# Patient Record
Sex: Male | Born: 1990 | Hispanic: Yes | Marital: Single | Smoking: Never smoker
Health system: Southern US, Community
[De-identification: ages and names within clinical notes are randomized; demographics above are authoritative.]

## PROBLEM LIST (undated history)

## (undated) DIAGNOSIS — K219 Gastro-esophageal reflux disease without esophagitis: Secondary | ICD-10-CM

## (undated) DIAGNOSIS — K8020 Calculus of gallbladder without cholecystitis without obstruction: Secondary | ICD-10-CM

## (undated) HISTORY — PX: NO PAST SURGERIES: SHX2092

---

## 2017-02-24 ENCOUNTER — Ambulatory Visit (INDEPENDENT_AMBULATORY_CARE_PROVIDER_SITE_OTHER): Payer: Self-pay | Admitting: Physician Assistant

## 2017-02-24 VITALS — BP 130/80 | HR 90 | Temp 97.6°F | Resp 18 | Ht 68.5 in | Wt 241.1 lb

## 2017-02-24 DIAGNOSIS — L309 Dermatitis, unspecified: Secondary | ICD-10-CM

## 2017-02-24 DIAGNOSIS — Z833 Family history of diabetes mellitus: Secondary | ICD-10-CM | POA: Insufficient documentation

## 2017-02-24 DIAGNOSIS — Z6836 Body mass index (BMI) 36.0-36.9, adult: Secondary | ICD-10-CM | POA: Insufficient documentation

## 2017-02-24 MED ORDER — PERMETHRIN 5 % EX CREA: 1.0000 "application " | TOPICAL_CREAM | Freq: Once | CUTANEOUS | 1 refills | Status: AC

## 2017-02-24 NOTE — Patient Instructions (Addendum)
Massage the cream thoroughly into the skin from the neck to the soles of the feet, including areas under the fingernails and toenails. Thirty grams is usually sufficient for a single application for an average adult. Your tube will have 60 grams.  In young children, scalp involvement is common. Therefore, permethrin should also be applied to the scalp and face (sparing the eyes and mouth) in this population. Permethrin should be removed by washing (shower or bath) after 8 to 14 hours. Treatment is often performed overnight.    A second application one to two weeks later may be necessary to eliminate mites and is typically performed. However, the relative efficacy of one versus two applications of permethrin has not been studied.  Drink at least 64 ounces of water daily. Consider a humidifier for the room where you sleep. Bathe once daily. Avoid using HOT water, as it dries skin. Avoid deodorant soaps (Dial is the worst!) and stick with gentle cleansers (I like Cetaphil Liquid Cleanser). After bathing, dry off completely, then apply a thick emollient cream (I like Cetaphil Moisturizing Cream). Apply the cream twice daily, or more!   IF you received an x-ray today, you will receive an invoice from [facility]. Please contact [facility] at [phone] with questions or concerns regarding your invoice.   IF you received labwork today, you will receive an invoice from [facility]. Please contact [facility] at [phone] with questions or concerns regarding your invoice.   Our billing staff will not be able to assist you with questions regarding bills from these companies.  You will be contacted with the lab results as soon as they are available. The fastest way to get your results is to activate your My Chart account. Instructions are located on the last page of this paperwork. If you have not heard from [place] regarding the results in 2 weeks, please contact this office.       Scabies, Adult Scabies is a skin condition that happens when very small insects get under the skin (infestation). This causes a rash and severe itchiness. Scabies can spread from person to person (is contagious). If you get scabies, it is common for others in your household to get scabies too. With proper treatment, symptoms usually go away in 2-4 weeks. Scabies usually does not cause lasting problems. What are the causes? This condition is caused by mites (Sarcoptes scabiei, or human itch mites) that can only be seen with a microscope. The mites get into the top layer of skin and lay eggs. Scabies can spread from person to person through:  Close contact with a person who has scabies.  Contact with infested items, such as towels, bedding, or clothing. What increases the risk? This condition is more likely to develop in:  People who live in nursing homes and other extended-care facilities.  People who have sexual contact with a partner who has scabies.  Young children who attend child care facilities.  People who care for others who are at increased risk for scabies. What are the signs or symptoms? Symptoms of this condition may include:  Severe itchiness. This is often worse at night.  A rash that includes tiny red bumps or blisters. The rash commonly occurs on the wrist, elbow, armpit, fingers, waist, groin, or buttocks. Bumps may form a line (burrow) in some areas.  Skin irritation. This can include scaly patches or sores. How is this diagnosed? This condition is diagnosed with a physical exam. Your health care provider will look closely at  your skin. In some cases, your health care provider may take a sample of your affected skin (skin scraping) and have it examined under a microscope. How is this treated? This condition may be treated with:  Medicated cream or lotion that kills the mites. This is spread on the entire body and left on for several hours. Usually, one  treatment with medicated cream or lotion is enough to kill all of the mites. In severe cases, the treatment may be repeated.  Medicated cream that relieves itching.  Medicines that help to relieve itching.  Medicines that kill the mites. This treatment is rarely used. Follow these instructions at home:   Medicines   Take or apply over-the-counter and prescription medicines as told by your health care provider.  Apply medicated cream or lotion as told by your health care provider.  Do not wash off the medicated cream or lotion until the necessary amount of time has passed. Skin Care   Avoid scratching your affected skin.  Keep your fingernails closely trimmed to reduce injury from scratching.  Take cool baths or apply cool washcloths to help reduce itching. General instructions   Clean all items that you recently had contact with, including bedding, clothing, and furniture. Do this on the same day that your treatment starts.  Use hot water when you wash items.  Place unwashable items into closed, airtight plastic bags for at least 3 days. The mites cannot live for more than 3 days away from human skin.  Vacuum furniture and mattresses that you use.  Make sure that other people who may have been infested are examined by a health care provider. These include members of your household and anyone who may have had contact with infested items.  Keep all follow-up visits as told by your health care provider. This is important. Contact a health care provider if:  You have itching that does not go away after 4 weeks of treatment.  You continue to develop new bumps or burrows.  You have redness, swelling, or pain in your rash area after treatment.  You have fluid, blood, or pus coming from your rash. This information is not intended to replace advice given to you by your health care provider. Make sure you discuss any questions you have with your health care provider. Document  Released: 08/05/2015 Document Revised: 04/21/2016 Document Reviewed: 06/16/2015 Elsevier Interactive Patient Education  2017 [employer].

## 2017-02-24 NOTE — Progress Notes (Signed)
Patient ID: Timothy Hebert, male     DOB: [birth year], 26 y.o.    MRN: [id number]  PCP: No PCP Per Patient  Chief Complaint  Patient presents with  . Skin Problem    on arm & legs x 1 week. itches & looks like small bumps. Younger brothers were told it may be scabies    Subjective:   This patient is new to this practice and presents for evaluation of an itchiy skin rash on the arms, legs and axilla. Also tops of the feet. Worse at night. At first thought it was allergic, and took cetirizine and used topical steroid cream.  Yesterday his younger brothers were diagnosed with scabies and were prescribed permethrin cream.    Review of Systems  Constitutional: Negative for chills and fever.  Respiratory: Negative for cough and shortness of breath.   Cardiovascular: Negative for chest pain, palpitations and leg swelling.  Gastrointestinal: Negative for diarrhea, nausea and vomiting.  Endocrine: Negative for polydipsia.  Genitourinary: Negative for dysuria, frequency and urgency.  Musculoskeletal: Negative for myalgias.  Skin: Negative for color change, pallor, rash and wound.  Neurological: Negative for dizziness and headaches.     Prior to Admission medications   Not on File     No Known Allergies   There are no active problems to display for this patient.    Family History  Problem Relation Age of Onset  . Diabetes Father      Social History   Social History  . Marital status: Single    Spouse name: N/A  . Number of children: 0  . Years of education: N/A   Occupational History  . student     [employer], anticipated graduation 03/2017   Social History Main Topics  . Smoking status: Never Smoker  . Smokeless tobacco: Never Used  . Alcohol use No  . Drug use: No  . Sexual activity: Not on file   Other Topics Concern  . Not on file   Social History Narrative   Lives with his parents and 3 younger brothers.   Born in [place]. Came to  the [place] since age 89 years.            Objective:  Physical Exam  Constitutional: He is oriented to person, place, and time. He appears well-developed and well-nourished. He is active and cooperative. No distress.  BP 130/80   Pulse 90   Temp 97.6 F (36.4 C) (Oral)   Resp 18   Ht 5' 8.5" (1.74 m)   Wt 241 lb 2 oz (109.4 kg)   SpO2 98%   BMI 36.13 kg/m    Eyes: Conjunctivae are normal.  Pulmonary/Chest: Effort normal.  Neurological: He is alert and oriented to person, place, and time.  Skin: Skin is warm and dry. Rash noted. Rash is papular (mostly excoriated, inner surface of both arms, flanks, lower legs and tops of feet; few lesions in the web spaces between the LEFT index-middle-ring fingers). He is not diaphoretic.  Psychiatric: He has a normal mood and affect. His speech is normal and behavior is normal.        Assessment & Plan:  1. Dermatitis Given close contacts with scabies, elect to treat as such. Anticipatory guidance provided regarding treatment and potential adverse effects, specifically, dry itchy skin.  - permethrin (ELIMITE) 5 % cream; Apply 1 application topically once. Repeat in 1 week if needed.  Dispense: 60 g; Refill: 1   Folasade Mooty S.  Leotis Shames, PA-C Physician Assistant-Certified Primary Care at [facility]

## 2017-04-27 ENCOUNTER — Ambulatory Visit (INDEPENDENT_AMBULATORY_CARE_PROVIDER_SITE_OTHER): Payer: Self-pay | Admitting: Physician Assistant

## 2017-04-27 ENCOUNTER — Ambulatory Visit (INDEPENDENT_AMBULATORY_CARE_PROVIDER_SITE_OTHER): Payer: Self-pay

## 2017-04-27 ENCOUNTER — Encounter: Payer: Self-pay | Admitting: Physician Assistant

## 2017-04-27 VITALS — BP 124/86 | HR 103 | Temp 97.5°F | Resp 16 | Ht 68.11 in | Wt 224.8 lb

## 2017-04-27 DIAGNOSIS — R824 Acetonuria: Secondary | ICD-10-CM

## 2017-04-27 DIAGNOSIS — L309 Dermatitis, unspecified: Secondary | ICD-10-CM

## 2017-04-27 DIAGNOSIS — R110 Nausea: Secondary | ICD-10-CM

## 2017-04-27 DIAGNOSIS — K5900 Constipation, unspecified: Secondary | ICD-10-CM

## 2017-04-27 DIAGNOSIS — R634 Abnormal weight loss: Secondary | ICD-10-CM

## 2017-04-27 DIAGNOSIS — R142 Eructation: Secondary | ICD-10-CM

## 2017-04-27 DIAGNOSIS — R6881 Early satiety: Secondary | ICD-10-CM

## 2017-04-27 LAB — POCT URINALYSIS DIP (MANUAL ENTRY)
GLUCOSE UA: NEGATIVE mg/dL
Leukocytes, UA: NEGATIVE
Nitrite, UA: NEGATIVE
Protein Ur, POC: 30 mg/dL — AB
RBC UA: NEGATIVE
SPEC GRAV UA: 1.025 (ref 1.010–1.025)
UROBILINOGEN UA: 0.2 U/dL
pH, UA: 6 (ref 5.0–8.0)

## 2017-04-27 LAB — POCT GLYCOSYLATED HEMOGLOBIN (HGB A1C): Hemoglobin A1C: 5.3

## 2017-04-27 MED ORDER — KETOCONAZOLE 2 % EX CREA: 1.0000 "application " | TOPICAL_CREAM | Freq: Every day | CUTANEOUS | 1 refills | Status: DC

## 2017-04-27 MED ORDER — RANITIDINE HCL 150 MG PO TABS: 150.0000 mg | ORAL_TABLET | Freq: Two times a day (BID) | ORAL | 0 refills | Status: DC

## 2017-04-27 NOTE — Progress Notes (Signed)
Patient ID: Timothy Hebert, male    DOB: 08-Feb-1991, 26 y.o.   MRN: [id number]  PCP: Timothy Hebert  Chief Complaint  Patient presents with  . Gastric Concerns    Hebert patient, has severe belching after eating, using OTC heartburn and gas medications; patient reports felling "full of air" each morning    Subjective:   Presents for evaluation of belching, early satiety. He is accompanied by Timothy Hebert.  Describes increased belching x 3-4 months, associated with increased flatulence and constipation. OTC Miralax helped the constipation. OTC Gaviscon and Phazyme helped the belching some.  On Thursday (5/24), he notes that he wasn't eating as much, due to the constipation. Ran at the gym, did Timothy normal routine, but felt a bit lightheaded. At home, in the shower, he began feeling worse. "Felt like I wasn't breathing right, was disoriented." He denies feeling confused, but rather jittery and weak. Fingers went numb. Vision felt dark. He asked Timothy Hebert and Hebert to call 911. He did not faint. EMS arrived and helped him slow Timothy breathing, and they stayed about 30 minutes, until he was back to Timothy normal state. He relates that he thinks he had a panic attack.  Stopped the Miralax following the panic attack because he wasn't eating as much. "Anything that I eat, I feel full," starts burping, feeling like he wants to vomit. OTC antacids help the nausea.   No hematochezia. Dark stool following pepto-bismol. Stopped the product and stools returned to normal color. No urinary symptoms. No fever, chills, sometimes feels over-warm.  No difficulty swallowing. No regurgitation.  Typical diet: Breakfast: Apple, banana and cereal (Honey Bunches of Oats), 2% milk Lunch: varies-usually vegetables, rice and chicken Supper: same as lunch Evening snack: cereal-Honey Bunches of Oats 3-17 oz bottles of water each day Was exercising 3x/week at the gym  Estimates 10 lb weight loss in  the past 2 weeks.  Rash in the bilateral inguinal folds x 2 years. Was itching, but not since use of anti-fungal cream.  Timothy Hebert has diabetes.  Review of Systems As above.    Patient Active Problem List   Diagnosis Date Noted  . BMI 36.0-36.9,adult 02/24/2017  . Family history of diabetes mellitus in Hebert 02/24/2017     Prior to Admission medications   Not on File     No Known Allergies     Objective:  Physical Exam  Constitutional: He is oriented to person, place, and time. He appears well-developed and well-nourished. He is active and cooperative. No distress.  BP 124/86 (BP Location: Right Arm, Patient Position: Sitting, Cuff Size: Large)   Pulse (!) 103   Temp 97.5 F (36.4 C) (Oral)   Resp 16   Ht 5' 8.11" (1.73 m)   Wt 224 lb 12.8 oz (102 kg)   SpO2 99%   BMI 34.07 kg/m   HENT:  Head: Normocephalic and atraumatic.  Right Ear: Hearing normal.  Left Ear: Hearing normal.  Eyes: Conjunctivae are normal. No scleral icterus.  Neck: Normal range of motion. Neck supple. No thyromegaly present.  Cardiovascular: Normal rate, regular rhythm and normal heart sounds.   Pulses:      Radial pulses are 2+ on the right side, and 2+ on the left side.  Pulmonary/Chest: Effort normal and breath sounds normal.  Abdominal: Soft. Normal appearance, normal aorta and bowel sounds are normal. There is no hepatosplenomegaly. There is no tenderness ("pressure" sensation in the suprapubic and RLQ with palpation).  There is no rigidity, no rebound, no guarding, no CVA tenderness, no tenderness at McBurney's point and negative Murphy's sign. No hernia.  Lymphadenopathy:       Head (right side): No tonsillar, no preauricular, no posterior auricular and no occipital adenopathy present.       Head (left side): No tonsillar, no preauricular, no posterior auricular and no occipital adenopathy present.    He has no cervical adenopathy.       Right: No supraclavicular adenopathy present.        Left: No supraclavicular adenopathy present.  Neurological: He is alert and oriented to person, place, and time. No sensory deficit.  Skin: Skin is warm, dry and intact. No rash noted. No cyanosis or erythema. Nails show no clubbing.  Psychiatric: He has a normal mood and affect. Timothy speech is normal and behavior is normal.      Results for orders placed or performed in visit on 04/27/17  POCT urinalysis dipstick  Result Value Ref Range   Color, UA yellow yellow   Clarity, UA clear clear   Glucose, UA negative negative mg/dL   Bilirubin, UA small (A) negative   Ketones, POC UA >= (160) (A) negative mg/dL   Spec Grav, UA [id number] [id number] - 1.025   Blood, UA negative negative   pH, UA 6.0 5.0 - 8.0   Protein Ur, POC =30 (A) negative mg/dL   Urobilinogen, UA 0.2 0.2 or 1.0 E.U./dL   Nitrite, UA Negative Negative   Leukocytes, UA Negative Negative  POCT glycosylated hemoglobin (Hb A1C)  Result Value Ref Range   Hemoglobin A1C 5.3     Dg Abd Acute W/chest  Result Date: 04/27/2017 CLINICAL DATA:  Constipation, nausea. EXAM: DG ABDOMEN ACUTE W/ 1V CHEST COMPARISON:  None. FINDINGS: There is no evidence of dilated bowel loops or free intraperitoneal air. Moderate stool burden is noted in the right and sigmoid colon. No radiopaque calculi or other significant radiographic abnormality is seen. Heart size and mediastinal contours are within normal limits. Both lungs are clear. IMPRESSION: No abnormal bowel dilatation is noted. Moderate stool burden is noted. No acute cardiopulmonary disease. Electronically Signed   By: Lupita RaiderJames  Green Jr, M.D.   On: 04/27/2017 10:45        Assessment & Plan:   1. Early satiety 2. Belching 4. Nausea without vomiting Possibly GERD. Await remaining labs. Trial of ranitidine. Continue Phazyme. - Comprehensive metabolic panel - CBC with Differential/Platelet - POCT urinalysis dipstick - H. pylori breath test - DG Abd Acute W/Chest; Future - ranitidine  (ZANTAC) 150 MG tablet; Take 1 tablet (150 mg total) by mouth 2 (two) times daily.  Dispense: 60 tablet; Refill: 0  3. Constipation, unspecified constipation type Resume Miralax. Increase dietary fiber and oral hydration. - TSH - T4, free - DG Abd Acute W/Chest; Future  5. Loss of weight Possibly due to healthier eating habits and more regular exercise recently. Ketonuria is an interesting finding, A1C is normal. Await remaining labs. - DG Abd Acute W/Chest; Future  6. Dermatitis Improved previously with OTC anti-fungal cream. Try more potent product. - ketoconazole (NIZORAL) 2 % cream; Apply 1 application topically daily.  Dispense: 60 g; Refill: 1  7. Ketonuria Anticipated elevated A1C, especially in conjunction with weight loss and GI symptoms. Increase oral hydration and recheck in 2 weeks. - POCT glycosylated hemoglobin (Hb A1C)    Return in about 2 weeks (around 05/11/2017) for re-evaluation.   Fernande Brashelle S. Hagan Maltz, PA-C Primary Care at [facility]  

## 2017-04-27 NOTE — Patient Instructions (Signed)
1. Continue the phazyme, 4 times daily (with meals and at bedtime). 2. Resume the Miralax. Take 1-2 doses each day. 3. Drink at LEAST 64 ounces of water every day, more on the days you exercise. 4. Increase the fiber in your diet. 5. Start the ranitidine that I have sent to the pharmacy.      IF you received an x-ray today, you will receive an invoice from [facility]. Please contact [facility] at [phone] with questions or concerns regarding your invoice.   IF you received labwork today, you will receive an invoice from [facility]. Please contact [facility] at [phone] with questions or concerns regarding your invoice.   Our billing staff will not be able to assist you with questions regarding bills from these companies.  You will be contacted with the lab results as soon as they are available. The fastest way to get your results is to activate your My Chart account. Instructions are located on the last page of this paperwork. If you have not heard from us regarding the results in 2 weeks, please contact this office.

## 2017-04-28 LAB — COMPREHENSIVE METABOLIC PANEL
A/G RATIO: 1.7 (ref 1.2–2.2)
ALBUMIN: 5.2 g/dL (ref 3.5–5.5)
ALK PHOS: 58 IU/L (ref 39–117)
ALT: 33 IU/L (ref 0–44)
AST: 26 IU/L (ref 0–40)
BILIRUBIN TOTAL: 0.6 mg/dL (ref 0.0–1.2)
BUN / CREAT RATIO: 13 (ref 9–20)
BUN: 13 mg/dL (ref 6–20)
CO2: 22 mmol/L (ref 18–29)
Calcium: 10.5 mg/dL — ABNORMAL HIGH (ref 8.7–10.2)
Chloride: 104 mmol/L (ref 96–106)
Creatinine, Ser: 1.01 mg/dL (ref 0.76–1.27)
GFR calc non Af Amer: 104 mL/min/{1.73_m2} (ref >59)
GFR, EST AFRICAN AMERICAN: 120 mL/min/{1.73_m2} (ref >59)
GLOBULIN, TOTAL: 3 g/dL (ref 1.5–4.5)
GLUCOSE: 107 mg/dL — AB (ref 65–99)
POTASSIUM: 4.5 mmol/L (ref 3.5–5.2)
SODIUM: 143 mmol/L (ref 134–144)
TOTAL PROTEIN: 8.2 g/dL (ref 6.0–8.5)

## 2017-04-28 LAB — CBC WITH DIFFERENTIAL/PLATELET
Basophils Absolute: 0 10*3/uL (ref 0.0–0.2)
Basos: 0 %
EOS (ABSOLUTE): 0.1 10*3/uL (ref 0.0–0.4)
Eos: 2 %
HEMOGLOBIN: 15.4 g/dL (ref 13.0–17.7)
Hematocrit: 45.2 % (ref 37.5–51.0)
Immature Grans (Abs): 0 10*3/uL (ref 0.0–0.1)
Immature Granulocytes: 0 %
Lymphocytes Absolute: 1.6 10*3/uL (ref 0.7–3.1)
Lymphs: 19 %
MCH: 29.7 pg (ref 26.6–33.0)
MCHC: 34.1 g/dL (ref 31.5–35.7)
MCV: 87 fL (ref 79–97)
MONOS ABS: 0.7 10*3/uL (ref 0.1–0.9)
Monocytes: 8 %
NEUTROS ABS: 6.1 10*3/uL (ref 1.4–7.0)
Neutrophils: 71 %
Platelets: 266 10*3/uL (ref 150–379)
RBC: 5.18 x10E6/uL (ref 4.14–5.80)
RDW: 13.2 % (ref 12.3–15.4)
WBC: 8.7 10*3/uL (ref 3.4–10.8)

## 2017-04-28 LAB — T4, FREE: Free T4: 1.5 ng/dL (ref 0.82–1.77)

## 2017-04-28 LAB — TSH: TSH: 1.9 u[IU]/mL (ref 0.450–4.500)

## 2017-05-01 LAB — H. PYLORI BREATH TEST: H pylori Breath Test: NEGATIVE

## 2017-05-02 ENCOUNTER — Encounter: Payer: Self-pay | Admitting: Physician Assistant

## 2017-05-12 ENCOUNTER — Encounter: Payer: Self-pay | Admitting: Physician Assistant

## 2017-05-12 ENCOUNTER — Ambulatory Visit (INDEPENDENT_AMBULATORY_CARE_PROVIDER_SITE_OTHER): Payer: Self-pay | Admitting: Physician Assistant

## 2017-05-12 VITALS — BP 125/76 | HR 90 | Temp 97.6°F | Resp 18 | Ht 68.11 in | Wt 220.2 lb

## 2017-05-12 DIAGNOSIS — R824 Acetonuria: Secondary | ICD-10-CM

## 2017-05-12 LAB — POCT URINALYSIS DIP (MANUAL ENTRY)
BILIRUBIN UA: NEGATIVE
Blood, UA: NEGATIVE
GLUCOSE UA: NEGATIVE mg/dL
Ketones, POC UA: NEGATIVE mg/dL
LEUKOCYTES UA: NEGATIVE
NITRITE UA: NEGATIVE
PH UA: 6 (ref 5.0–8.0)
Protein Ur, POC: NEGATIVE mg/dL
Spec Grav, UA: 1.025 (ref 1.010–1.025)
Urobilinogen, UA: 0.2 E.U./dL

## 2017-05-12 NOTE — Patient Instructions (Addendum)
Consider resuming the Phazyme to help reduce the belching.  Keep up the great work of being a healthier you!    IF you received an x-ray today, you will receive an invoice from [facility]. Please contact [facility] at [phone] with questions or concerns regarding your invoice.   IF you received labwork today, you will receive an invoice from [facility]. Please contact [facility] at [phone] with questions or concerns regarding your invoice.   Our billing staff will not be able to assist you with questions regarding bills from these companies.  You will be contacted with the lab results as soon as they are available. The fastest way to get your results is to activate your My Chart account. Instructions are located on the last page of this paperwork. If you have not heard from us regarding the results in 2 weeks, please contact this office.

## 2017-05-12 NOTE — Progress Notes (Signed)
Patient ID: Timothy Hebert, male    DOB: [birth year], 26 y.o.   MRN: [id number]  PCP: Porfirio Oar, PA-C  Chief Complaint  Patient presents with  . GI Problem  . Follow-up    Subjective:   Presents for evaluation of early satiety. He is accompanied by his mother. His father is listening in on the mother's phone.  At his visit 5/31 he described frequent belching, flatulence, constipation and feeling full of air. He had cut back on his oral intake, and then had an episode of lightheadedness after exercise. He had been making healthier eating changes and exercising, but weight loss had accelerated.  Labs were normal except ketonuria. A1C was normal. 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  He was advised to resume Miralax, Phazyme and start ranitidine.  Today he reports that he is well. His symptoms are all resolved except the belching, though it is better than before.  He has reduced carbonated beverages. No chewing gum.  Review of Systems As above    Patient Active Problem List   Diagnosis Date Noted  . BMI 36.0-36.9,adult 02/24/2017  . Family history of diabetes mellitus in father 02/24/2017     Prior to Admission medications   Medication Sig Start Date End Date Taking? Authorizing Provider  ketoconazole (NIZORAL) 2 % cream Apply 1 application topically daily. 04/27/17  Yes Merdith Boyd, PA-C  ranitidine (ZANTAC) 150 MG tablet Take 1 tablet (150 mg total) by mouth 2 (two) times daily. 04/27/17  Yes Tamme Mozingo, PA-C     No Known Allergies     Objective:  Physical Exam  Constitutional: He is oriented to person, place, and time. He appears well-developed and well-nourished. He is active and cooperative. No distress.  BP 125/76   Pulse 90   Temp 97.6 F (36.4 C) (Oral)   Resp 18   Ht 5' 8.11" (1.73 m)   Wt 220 lb 3.2 oz (99.9 kg)   SpO2 99%   BMI 33.37 kg/m   HENT:  Head: Normocephalic and atraumatic.  Right Ear: Hearing normal.    Left Ear: Hearing normal.  Eyes: Conjunctivae are normal. No scleral icterus.  Neck: Normal range of motion. Neck supple. No thyromegaly present.  Cardiovascular: Normal rate, regular rhythm and normal heart sounds.   Pulses:      Radial pulses are 2+ on the right side, and 2+ on the left side.  Pulmonary/Chest: Effort normal and breath sounds normal.  Abdominal: Soft. Normal appearance and bowel sounds are normal. There is no hepatosplenomegaly. There is no tenderness.  Lymphadenopathy:       Head (right side): No tonsillar, no preauricular, no posterior auricular and no occipital adenopathy present.       Head (left side): No tonsillar, no preauricular, no posterior auricular and no occipital adenopathy present.    He has no cervical adenopathy.       Right: No supraclavicular adenopathy present.       Left: No supraclavicular adenopathy present.  Neurological: He is alert and oriented to person, place, and time. No sensory deficit.  Skin: Skin is warm, dry and intact. No rash noted. No cyanosis or erythema. Nails show no clubbing.  Psychiatric: He has a normal mood and affect. His speech is normal and behavior is normal.    Wt Readings from Last 3 Encounters:  05/12/17 220 lb 3.2 oz (99.9 kg)  04/27/17 224 lb 12.8 oz (102 kg)  02/24/17 241 lb 2 oz (109.4 kg)  Results for orders placed or performed in visit on 05/12/17  POCT urinalysis dipstick  Result Value Ref Range   Color, UA yellow yellow   Clarity, UA clear clear   Glucose, UA negative negative mg/dL   Bilirubin, UA negative negative   Ketones, POC UA negative negative mg/dL   Spec Grav, UA 9.1471.025 8.2951.010 - 1.025   Blood, UA negative negative   pH, UA 6.0 5.0 - 8.0   Protein Ur, POC negative negative mg/dL   Urobilinogen, UA 0.2 0.2 or 1.0 E.U./dL   Nitrite, UA Negative Negative   Leukocytes, UA Negative Negative       Assessment & Plan:   1. Ketonuria Resolved. - POCT urinalysis dipstick  Continue healthy  lifestyle changes. Consider restarting Phazyme.   Return in about 3 months (around 08/12/2017) for re-evaluation of belching, reflux, constipation, healthy lifestyle changes.   Fernande Brashelle S. Alaycia Eardley, PA-C Primary Care at [facility]

## 2017-06-24 ENCOUNTER — Emergency Department (HOSPITAL_COMMUNITY): Payer: Self-pay

## 2017-06-24 ENCOUNTER — Emergency Department (HOSPITAL_COMMUNITY)
Admission: EM | Admit: 2017-06-24 | Discharge: 2017-06-24 | Disposition: A | Discharge status: Home / Self Care | Payer: Self-pay | Attending: Emergency Medicine | Admitting: Emergency Medicine

## 2017-06-24 ENCOUNTER — Encounter (HOSPITAL_COMMUNITY): Payer: Self-pay | Admitting: Emergency Medicine

## 2017-06-24 DIAGNOSIS — R748 Abnormal levels of other serum enzymes: Secondary | ICD-10-CM | POA: Insufficient documentation

## 2017-06-24 DIAGNOSIS — K8020 Calculus of gallbladder without cholecystitis without obstruction: Secondary | ICD-10-CM | POA: Insufficient documentation

## 2017-06-24 DIAGNOSIS — R1010 Upper abdominal pain, unspecified: Secondary | ICD-10-CM

## 2017-06-24 DIAGNOSIS — Z79899 Other long term (current) drug therapy: Secondary | ICD-10-CM | POA: Insufficient documentation

## 2017-06-24 DIAGNOSIS — R112 Nausea with vomiting, unspecified: Secondary | ICD-10-CM | POA: Insufficient documentation

## 2017-06-24 HISTORY — DX: Gastro-esophageal reflux disease without esophagitis: K21.9

## 2017-06-24 LAB — CBC WITH DIFFERENTIAL/PLATELET
Basophils Absolute: 0 K/uL (ref 0.0–0.1)
Basophils Relative: 0 %
Eosinophils Absolute: 0.1 K/uL (ref 0.0–0.7)
Eosinophils Relative: 1 %
HCT: 39.3 % (ref 39.0–52.0)
Hemoglobin: 13.7 g/dL (ref 13.0–17.0)
Lymphocytes Relative: 10 %
Lymphs Abs: 1.6 K/uL (ref 0.7–4.0)
MCH: 29.9 pg (ref 26.0–34.0)
MCHC: 34.9 g/dL (ref 30.0–36.0)
MCV: 85.8 fL (ref 78.0–100.0)
Monocytes Absolute: 0.7 K/uL (ref 0.1–1.0)
Monocytes Relative: 5 %
Neutro Abs: 13.3 K/uL — ABNORMAL HIGH (ref 1.7–7.7)
Neutrophils Relative %: 84 %
Platelets: 228 K/uL (ref 150–400)
RBC: 4.58 MIL/uL (ref 4.22–5.81)
RDW: 12 % (ref 11.5–15.5)
WBC: 15.7 K/uL — ABNORMAL HIGH (ref 4.0–10.5)

## 2017-06-24 LAB — COMPREHENSIVE METABOLIC PANEL WITH GFR
ALT: 119 U/L — ABNORMAL HIGH (ref 17–63)
AST: 136 U/L — ABNORMAL HIGH (ref 15–41)
Albumin: 4.6 g/dL (ref 3.5–5.0)
Alkaline Phosphatase: 71 U/L (ref 38–126)
Anion gap: 7 (ref 5–15)
BUN: 18 mg/dL (ref 6–20)
CO2: 27 mmol/L (ref 22–32)
Calcium: 9.4 mg/dL (ref 8.9–10.3)
Chloride: 105 mmol/L (ref 101–111)
Creatinine, Ser: 1.02 mg/dL (ref 0.61–1.24)
GFR calc Af Amer: >60 mL/min
GFR calc non Af Amer: >60 mL/min
Glucose, Bld: 119 mg/dL — ABNORMAL HIGH (ref 65–99)
Potassium: 3.6 mmol/L (ref 3.5–5.1)
Sodium: 139 mmol/L (ref 135–145)
Total Bilirubin: 0.7 mg/dL (ref 0.3–1.2)
Total Protein: 7.8 g/dL (ref 6.5–8.1)

## 2017-06-24 LAB — LIPASE, BLOOD: Lipase: 25 U/L (ref 11–51)

## 2017-06-24 MED ORDER — ONDANSETRON 4 MG PO TBDP: ORAL_TABLET | ORAL | 0 refills | Status: AC

## 2017-06-24 MED ORDER — TRAMADOL HCL 50 MG PO TABS: 50.0000 mg | ORAL_TABLET | Freq: Four times a day (QID) | ORAL | 0 refills | Status: AC | PRN

## 2017-06-24 MED ORDER — PANTOPRAZOLE SODIUM 20 MG PO TBEC: 20.0000 mg | DELAYED_RELEASE_TABLET | Freq: Every day | ORAL | 0 refills | Status: AC

## 2017-06-24 MED ORDER — PANTOPRAZOLE SODIUM 40 MG IV SOLR
40.0000 mg | Freq: Once | INTRAVENOUS | Status: AC
  Administered 2017-06-24: 40 mg via INTRAVENOUS
  Filled 2017-06-24: qty 40

## 2017-06-24 NOTE — Discharge Instructions (Signed)
Maintain a complete nonfat diet. You need to follow-up this week with [facility] to schedule removal of your gallbladder. Return to the emergency department if you have return of the pain, fevers or vomiting.

## 2017-06-24 NOTE — ED Triage Notes (Signed)
Patient with abdominal pain which started about a half hour ago.  Pain is in the upper abdominal area radiating around to his back.  Denies any problems with urination.  Patient was treated recently for gerd.  He told EMS that he vomited prior to their arrival.  EMS gave him some PO zofran 4mg  and he feels better now.

## 2017-06-24 NOTE — ED Notes (Signed)
Patient reports pain improved now.

## 2017-06-24 NOTE — ED Provider Notes (Signed)
[facility]-EMERGENCY DEPT Provider Note   CSN: [id number] Arrival date & time: 06/24/17  1837     History   Chief Complaint Chief Complaint  Patient presents with  . Abdominal Pain    HPI Timothy Hebert is a 26 y.o. male.  Patient is a 26 year old male with a history of GERD who presents with upper abdominal pain. He states he had a sudden onset of pain across his upper abdomen that radiates straight through to his back. It started about a half hour prior to arrival. He is brought in by EMS. He had one episode of vomiting. He feels better after he got Zofran by EMS. He denies any change in bowel habits. No diarrhea or constipation. No blood in his stool. No bloody emesis. No dysuria. He states he's had previous issues with GERD and frequent belching. This improved when he was taking an acid reducing medicine however he stopped taking it about 4-5 weeks ago.      Past Medical History:  Diagnosis Date  . GERD (gastroesophageal reflux disease)     Patient Active Problem List   Diagnosis Date Noted  . BMI 36.0-36.9,adult 02/24/2017  . Family history of diabetes mellitus in father 02/24/2017    Past Surgical History:  Procedure Laterality Date  . NO PAST SURGERIES         Home Medications    Prior to Admission medications   Medication Sig Start Date End Date Taking? Authorizing Provider  ranitidine (ZANTAC) 150 MG tablet Take 1 tablet (150 mg total) by mouth 2 (two) times daily. 04/27/17  Yes Jeffery, Chelle, PA-C  simethicone (MYLICON) 80 MG chewable tablet Chew 80-160 mg by mouth every 6 (six) hours as needed (gas pain and bloating).   Yes [provider]  ketoconazole (NIZORAL) 2 % cream Apply 1 application topically daily. Patient not taking: Reported on 06/24/2017 04/27/17   Porfirio OarJeffery, Chelle, PA-C  ondansetron (ZOFRAN ODT) 4 MG disintegrating tablet 4mg  ODT q4 hours prn nausea/vomit 06/24/17   Rolan BuccoBelfi, Khian Remo, MD  pantoprazole (PROTONIX) 20 MG tablet Take 1 tablet  (20 mg total) by mouth daily. 06/24/17   Rolan BuccoBelfi, Elfrieda Espino, MD  traMADol (ULTRAM) 50 MG tablet Take 1 tablet (50 mg total) by mouth every 6 (six) hours as needed. 06/24/17   Rolan BuccoBelfi, Arianni Gallego, MD    Family History Family History  Problem Relation Age of Onset  . Diabetes Father     Social History Social History  Substance Use Topics  . Smoking status: Never Smoker  . Smokeless tobacco: Never Used  . Alcohol use No     Allergies   Patient has no known allergies.   Review of Systems Review of Systems  Constitutional: Negative for chills, diaphoresis, fatigue and fever.  HENT: Negative for congestion, rhinorrhea and sneezing.   Eyes: Negative.   Respiratory: Negative for cough, chest tightness and shortness of breath.   Cardiovascular: Negative for chest pain and leg swelling.  Gastrointestinal: Positive for abdominal pain, nausea and vomiting. Negative for blood in stool and diarrhea.  Genitourinary: Negative for difficulty urinating, flank pain, frequency and hematuria.  Musculoskeletal: Negative for arthralgias and back pain.  Skin: Negative for rash.  Neurological: Negative for dizziness, speech difficulty, weakness, numbness and headaches.     Physical Exam Updated Vital Signs BP 126/90 (BP Location: Left Arm)   Pulse 74   Temp 98.2 F (36.8 C) (Oral)   Resp 16   Ht 5\' 8"  (1.727 m)   Wt 97.1 kg (214 lb)  SpO2 100%   BMI 32.54 kg/m   Physical Exam  Constitutional: He is oriented to person, place, and time. He appears well-developed and well-nourished.  HENT:  Head: Normocephalic and atraumatic.  Eyes: Pupils are equal, round, and reactive to light.  Neck: Normal range of motion. Neck supple.  Cardiovascular: Normal rate, regular rhythm and normal heart sounds.   Pulmonary/Chest: Effort normal and breath sounds normal. No respiratory distress. He has no wheezes. He has no rales. He exhibits no tenderness.  Abdominal: Soft. Bowel sounds are normal. There is  tenderness (Mild tenderness across the upper abdomen). There is no rebound and no guarding.  Musculoskeletal: Normal range of motion. He exhibits no edema.  Lymphadenopathy:    He has no cervical adenopathy.  Neurological: He is alert and oriented to person, place, and time.  Skin: Skin is warm and dry. No rash noted.  Psychiatric: He has a normal mood and affect.     ED Treatments / Results  Labs (all labs ordered are listed, but only abnormal results are displayed) Labs Reviewed  COMPREHENSIVE METABOLIC PANEL - Abnormal; Notable for the following:       Result Value   Glucose, Bld 119 (*)    AST 136 (*)    ALT 119 (*)    All other components within normal limits  CBC WITH DIFFERENTIAL/PLATELET - Abnormal; Notable for the following:    WBC 15.7 (*)    Neutro Abs 13.3 (*)    All other components within normal limits  LIPASE, BLOOD    EKG  EKG Interpretation None       Radiology Kore[place] Abdomen Limited Ruq  Result Date: 06/24/2017 CLINICAL DATA:  Acute right upper quadrant abdominal pain. EXAM: ULTRASOUND ABDOMEN LIMITED RIGHT UPPER QUADRANT COMPARISON:  None. FINDINGS: Gallbladder: Cholelithiasis is noted with the largest gallstone measuring 7 mm. No gallbladder wall thickening or pericholecystic fluid is noted. No sonographic Murphy's sign is noted. Sludge is noted within gallbladder lumen. Common bile duct: Diameter: 2.8 mm which is within normal limits. Liver: No focal lesion identified. Increased echogenicity of hepatic parenchyma is noted suggesting fatty infiltration. IMPRESSION: Fatty infiltration of the liver. Mild cholelithiasis and gallbladder sludge is noted without definite evidence of cholecystitis. Electronically Signed   By: Lupita RaiderJames  Green Jr, M.D.   On: 06/24/2017 21:13    Procedures Procedures (including critical care time)  Medications Ordered in ED Medications  pantoprazole (PROTONIX) injection 40 mg (40 mg Intravenous Given 06/24/17 1943)     Initial  Impression / Assessment and Plan / ED Course  I have reviewed the triage vital signs and the nursing notes.  Pertinent labs & imaging results that were available during my care of the patient were reviewed by me and considered in my medical decision making (see chart for details).    Patient is a 26 year old male who presents with sudden onset of upper abdominal pain. On exam, he was nontender. However given his symptoms I did do an ultrasound of gallbladder which shows cholelithiasis without evidence of cholecystitis. His liver enzymes are mildly elevated. His bilirubin is normal. His lipase is normal. His common bile duct does not appear distended. Ice spoke with Dr. Andrey CampanileWilson with general surgery who felt that if patient's symptoms were controlled and he is able to drink without return of symptoms, he can be discharged with close follow-up with surgery. Patient is drinking water without any pain or discomfort. No vomiting. He was discharged home in good condition. He was strongly encouraged to  follow-up with [facility] [place] surgery within the next week. He will call Monday for an appointment. He was advised to maintain a strict nonfat diet. He was given instructions to return if he has any return of the pain, fevers or vomiting.   Final Clinical Impressions(s) / ED Diagnoses   Final diagnoses:  Upper abdominal pain  Calculus of gallbladder without cholecystitis without obstruction  Elevated liver enzymes    New Prescriptions New Prescriptions   ONDANSETRON (ZOFRAN ODT) 4 MG DISINTEGRATING TABLET    4mg  ODT q4 hours prn nausea/vomit   PANTOPRAZOLE (PROTONIX) 20 MG TABLET    Take 1 tablet (20 mg total) by mouth daily.   TRAMADOL (ULTRAM) 50 MG TABLET    Take 1 tablet (50 mg total) by mouth every 6 (six) hours as needed.     Rolan Bucco, MD 06/24/17 [phone]

## 2017-06-24 NOTE — ED Notes (Signed)
Bed: [id number] Expected date:  Expected time:  Means of arrival:  Comments: 26 yo abd pain

## 2017-06-24 NOTE — ED Notes (Signed)
P.O. Challenge done. Tolerated well. 

## 2017-06-26 ENCOUNTER — Other Ambulatory Visit: Payer: Self-pay | Admitting: General Surgery

## 2017-06-28 NOTE — Progress Notes (Signed)
cxr 04-27-17 epic

## 2017-06-28 NOTE — Patient Instructions (Addendum)
Timothy Hebert  06/28/2017   Your procedure is scheduled on: 07-05-17  [facility]  Entrance [place]  elevators to 3rd floor to  Short Stay Center at [facility].   Call this number if you have problems the morning of surgery [phone]   Remember: ONLY 1 PERSON MAY GO WITH YOU TO SHORT STAY TO GET  READY MORNING OF YOUR SURGERY.  Do not eat food or drink liquids :After Midnight.    Take these medicines the morning of surgery with A SIP OF WATER: pantoprazole(protonix), ranitidine(zantac) if needed,  Tramadol if needed                               You may not have any metal on your body including hair pins and              piercings  Do not wear jewelry, make-up, lotions, powders or perfumes, deodorant                   Men may shave face and neck.   Do not bring valuables to the hospital. [facility] IS NOT             RESPONSIBLE   FOR VALUABLES.  Contacts, dentures or bridgework may not be worn into surgery.      Patients discharged the day of surgery will not be allowed to drive home.  Name and phone number of your driver:  Special Instructions: N/A              Please read over the following fact sheets you were given: _____________________________________________________________________           [facility] - Preparing for [facility] Before surgery, you can play an important role.  Because skin is not sterile, your skin needs to be as free of germs as possible.  You can reduce the number of germs on your skin by washing with CHG (chlorahexidine gluconate) soap before surgery.  CHG is an antiseptic cleaner which kills germs and bonds with the skin to continue killing germs even after washing. Please DO NOT use if you have an allergy to CHG or antibacterial soaps.  If your skin becomes reddened/irritated stop using the CHG and inform your nurse when you arrive at Short Stay. Do not shave (including legs and underarms) for at least 48 hours  prior to the first CHG shower.  You may shave your face/neck. Please follow these instructions carefully:  1.  Shower with CHG Soap the night before surgery and the  morning of Surgery.  2.  If you choose to wash your hair, wash your hair first as usual with your  normal  shampoo.  3.  After you shampoo, rinse your hair and body thoroughly to remove the  shampoo.                           4.  Use CHG as you would any other liquid soap.  You can apply chg directly  to the skin and wash                       Gently with a scrungie or clean washcloth.  5.  Apply the CHG Soap to your body ONLY FROM THE NECK DOWN.  Do not use on face/ open                           Wound or open sores. Avoid contact with eyes, ears mouth and genitals (private parts).                       Wash face,  Genitals (private parts) with your normal soap.             6.  Wash thoroughly, paying special attention to the area where your surgery  will be performed.  7.  Thoroughly rinse your body with warm water from the neck down.  8.  DO NOT shower/wash with your normal soap after using and rinsing off  the CHG Soap.                9.  Pat yourself dry with a clean towel.            10.  Wear clean pajamas.            11.  Place clean sheets on your bed the night of your first shower and do not  sleep with pets. Day of Surgery : Do not apply any lotions/deodorants the morning of surgery.  Please wear clean clothes to the hospital/surgery center.  FAILURE TO FOLLOW THESE INSTRUCTIONS MAY RESULT IN THE CANCELLATION OF YOUR SURGERY PATIENT SIGNATURE_________________________________  NURSE SIGNATURE__________________________________  ________________________________________________________________________

## 2017-06-29 ENCOUNTER — Encounter (HOSPITAL_COMMUNITY): Payer: Self-pay

## 2017-06-29 ENCOUNTER — Encounter (INDEPENDENT_AMBULATORY_CARE_PROVIDER_SITE_OTHER): Payer: Self-pay

## 2017-06-29 ENCOUNTER — Encounter (HOSPITAL_COMMUNITY)
Admission: RE | Admit: 2017-06-29 | Discharge: 2017-06-29 | Disposition: A | Discharge status: Home / Self Care | Payer: Self-pay | Source: Ambulatory Visit | Attending: General Surgery | Admitting: General Surgery

## 2017-06-29 DIAGNOSIS — Z01812 Encounter for preprocedural laboratory examination: Secondary | ICD-10-CM | POA: Insufficient documentation

## 2017-06-29 DIAGNOSIS — K8080 Other cholelithiasis without obstruction: Secondary | ICD-10-CM | POA: Insufficient documentation

## 2017-06-29 HISTORY — DX: Calculus of gallbladder without cholecystitis without obstruction: K80.20

## 2017-06-29 LAB — CBC WITH DIFFERENTIAL/PLATELET
BASOS ABS: 0 10*3/uL (ref 0.0–0.1)
BASOS PCT: 0 %
EOS ABS: 0.2 10*3/uL (ref 0.0–0.7)
Eosinophils Relative: 2 %
HEMATOCRIT: 41.1 % (ref 39.0–52.0)
HEMOGLOBIN: 14.5 g/dL (ref 13.0–17.0)
Lymphocytes Relative: 28 %
Lymphs Abs: 2 10*3/uL (ref 0.7–4.0)
MCH: 30.3 pg (ref 26.0–34.0)
MCHC: 35.3 g/dL (ref 30.0–36.0)
MCV: 85.8 fL (ref 78.0–100.0)
MONO ABS: 0.6 10*3/uL (ref 0.1–1.0)
Monocytes Relative: 9 %
NEUTROS ABS: 4.3 10*3/uL (ref 1.7–7.7)
NEUTROS PCT: 61 %
Platelets: 246 10*3/uL (ref 150–400)
RBC: 4.79 MIL/uL (ref 4.22–5.81)
RDW: 12.2 % (ref 11.5–15.5)
WBC: 7 10*3/uL (ref 4.0–10.5)

## 2017-06-29 LAB — COMPREHENSIVE METABOLIC PANEL
ALK PHOS: 80 U/L (ref 38–126)
ALT: 221 U/L — ABNORMAL HIGH (ref 17–63)
ANION GAP: 9 (ref 5–15)
AST: 33 U/L (ref 15–41)
Albumin: 4.5 g/dL (ref 3.5–5.0)
BUN: 9 mg/dL (ref 6–20)
CALCIUM: 9.6 mg/dL (ref 8.9–10.3)
CO2: 26 mmol/L (ref 22–32)
Chloride: 103 mmol/L (ref 101–111)
Creatinine, Ser: 0.86 mg/dL (ref 0.61–1.24)
GFR calc non Af Amer: >60 mL/min (ref >60)
Glucose, Bld: 82 mg/dL (ref 65–99)
Potassium: 4.2 mmol/L (ref 3.5–5.1)
SODIUM: 138 mmol/L (ref 135–145)
TOTAL PROTEIN: 7.8 g/dL (ref 6.5–8.1)
Total Bilirubin: 0.7 mg/dL (ref 0.3–1.2)

## 2017-06-29 NOTE — Progress Notes (Signed)
CMP result routed via epic to Dr Rexene EdisonH. Derrell LollingIngram

## 2017-06-30 NOTE — H&P (Signed)
Timothy Hebert [facility] Patient #: [id number] DOB: 06/17/1991 Single / Language: [place] / Race: Refused to Report/Unreported Male        History of Present Illness     This is a generally healthy 26 year old Hispanic male. He is here with his mother and father to discuss gallbladder surgery. He was referred by Dr. Fredderick PhenixBelfi in the emergency department.       48 hours ago after eating pizza and serial he developed upper abdominal pain that radiated to his back. This became quite severe and alarming and he called EMS and was taken to the emergency room. He had 1 episode of vomiting. The severe pain resolved in less than an hour and he had mild pain for a few hours and all the pain has resolved. He's been belching and bloating some of but has been following a low-fat diet and taking Protonix and feels normal today. Lab work in the ED showed WBC 15,700. ALT 136. AST 119. Lipase 25. Ultrasound showed small gallstones 7 mm or less in size. No inflammation. CBD 2.8 mm      Comorbidities are minimal. He has some reflux symptoms. BMI 32. May have had an episode of panic attack or hyperventilation once in the past that has resolved. Family history reveals mother living and well. Follow living with diabetes Social history reveals he recently graduated from in [employer] with a civil engineering diseases degree and is [place] in [place]. Single with no children. Denies alcohol or tobacco.     I advised the patient and his parents that this was almost certainly a gallbladder attack and he is at high risk for continued attacks in the future as well as complications. I have advised elective cholecystectomy and he would like to go ahead with that this summer He will be scheduled for laparoscopic cholecystectomy with cholangiogram, possible open cholecystectomy in the near future.      I advised low-fat diet and continued use of proton pump inhibitors I discussed the  indications, details, techniques, and numerous risk of the surgery with him. He is aware of the risk of bleeding, infection, conversion to open laparotomy, injury to adjacent organs with major reconstructive surgery, bile leak, trocar site hernia, hospitalization if CBD stones are found, diarrhea, pancreatitis and other unforeseen problems. He understands all of these issues well. All of his questions are answered. He agrees with this plan.   Past Surgical History No pertinent past surgical history   Allergies No Known Allergies  Medication History Ketoconazole (2% Cream, External) Active. Ondansetron (4MG  Tablet, Oral) Active. Pantoprazole Sodium (20MG  Tablet DR, Oral) Active. TraMADol HCl (50MG  Tablet, Oral) Active. Medications Reconciled  Social History Caffeine use  Carbonated beverages, Tea. No alcohol use  No drug use  Tobacco use  Never smoker.  Other Problem Gastroesophageal Reflux Disease     Review of System General Not Present- Appetite Loss, Chills, Fatigue, Fever, Night Sweats, Weight Gain and Weight Loss. HEENT Not Present- Earache, Hearing Loss, Hoarseness, Nose Bleed, Oral Ulcers, Ringing in the Ears, Seasonal Allergies, Sinus Pain, Sore Throat, Visual Disturbances, Wears glasses/contact lenses and Yellow Eyes. Respiratory Not Present- Bloody sputum, Chronic Cough, Difficulty Breathing, Snoring and Wheezing. Breast Not Present- Breast Mass, Breast Pain, Nipple Discharge and Skin Changes. Cardiovascular Not Present- Chest Pain, Difficulty Breathing Lying Down, Leg Cramps, Palpitations, Rapid Heart Rate, Shortness of Breath and Swelling of Extremities. Gastrointestinal Present- Abdominal Pain, Excessive gas and Nausea. Not Present- Bloating, Bloody Stool, Change in  Bowel Habits, Chronic diarrhea, Constipation, Difficulty Swallowing, Gets full quickly at meals, Hemorrhoids, Indigestion, Rectal Pain and Vomiting. Male Genitourinary Not Present- Blood in  Urine, Change in Urinary Stream, Frequency, Impotence, Nocturia, Painful Urination, Urgency and Urine Leakage. Musculoskeletal Not Present- Back Pain, Joint Pain, Joint Stiffness, Muscle Pain, Muscle Weakness and Swelling of Extremities. Neurological Not Present- Decreased Memory, Fainting, Headaches, Numbness, Seizures, Tingling, Tremor, Trouble walking and Weakness. Endocrine Not Present- Cold Intolerance, Excessive Hunger, Hair Changes, Heat Intolerance, Hot flashes and New Diabetes. Hematology Not Present- Blood Thinners, Easy Bruising, Excessive bleeding, Gland problems, HIV and Persistent Infections.  Vitals Weight: 213.6 lb Height: 68in Body Surface Area: 2.1 m Body Mass Index: 32.48 kg/m  Temp.: 98.65F  Pulse: 82 (Regular)  BP: 110/80 (Sitting, Left Arm, Standard)    H & P General Mental Status-Alert. General Appearance-Consistent with stated age. Hydration-Well hydrated. Voice-Normal. Note: Pleasant young man. No distress. Speaks English fluently without accent.   Head and Neck Head-normocephalic, atraumatic with no lesions or palpable masses. Trachea-midline. Thyroid Gland Characteristics - normal size and consistency.  Eye Eyeball - Bilateral-Extraocular movements intact. Sclera/Conjunctiva - Bilateral-No scleral icterus.  Chest and Lung Exam Chest and lung exam reveals -quiet, even and easy respiratory effort with no use of accessory muscles and on auscultation, normal breath sounds, no adventitious sounds and normal vocal resonance. Inspection Chest Wall - Normal. Back - normal.  Cardiovascular Cardiovascular examination reveals -normal heart sounds, regular rate and rhythm with no murmurs and normal pedal pulses bilaterally.  Abdomen Inspection Inspection of the abdomen reveals - No Hernias. Skin - Scar - no surgical scars. Palpation/Percussion Palpation and Percussion of the abdomen reveal - Soft, Non Tender, No Rebound  tenderness, No Rigidity (guarding) and No hepatosplenomegaly. Auscultation Auscultation of the abdomen reveals - Bowel sounds normal. Note: Soft and nontender. Borderline obesity. No mass or hernia.   Neurologic Neurologic evaluation reveals -alert and oriented x 3 with no impairment of recent or remote memory. Mental Status-Normal.  Musculoskeletal Normal Exam - Left-Upper Extremity Strength Normal and Lower Extremity Strength Normal. Normal Exam - Right-Upper Extremity Strength Normal and Lower Extremity Strength Normal.  Lymphatic Head & Neck  General Head & Neck Lymphatics: Bilateral - Description - Normal. Axillary  General Axillary Region: Bilateral - Description - Normal. Tenderness - Non Tender. Femoral & Inguinal  Generalized Femoral & Inguinal Lymphatics: Bilateral - Description - Normal. Tenderness - Non Tender.    Assessment & Plan GALLSTONES (K80.20)    Your recent episode of abdominal pain and vomiting and back pain is very consistent with a gallbladder attack Ultrasound confirms gallstones Your liver function tests are slightly abnormal It is very likely that this was a gallbladder attack and very likely that this will happen again in the future  You have been advised and have agreed to proceed with laparoscopic cholecystectomy with cholangiogram, possible open cholecystectomy We have discussed the indications, techniques, and risk of the surgery in detail  Please read the patient information booklet that I reviewed with you  In the meantime continue to take the Protonix medicine In the meantime I recommend a very low-fat diet No alcohol    Americus Scheurich M. Derrell LollingIngram, M.D., [facility] General and Minimally invasive Surgery Breast and Colorectal Surgery Office:   [phone] Pager:   [phone]

## 2017-07-05 ENCOUNTER — Encounter (HOSPITAL_COMMUNITY): Payer: Self-pay | Admitting: General Surgery

## 2017-07-05 ENCOUNTER — Ambulatory Visit (HOSPITAL_COMMUNITY): Payer: Self-pay

## 2017-07-05 ENCOUNTER — Ambulatory Visit (HOSPITAL_COMMUNITY): Payer: Self-pay | Admitting: Certified Registered Nurse Anesthetist

## 2017-07-05 ENCOUNTER — Encounter (HOSPITAL_COMMUNITY)
Admission: RE | Disposition: A | Discharge status: Home / Self Care | Payer: Self-pay | Source: Ambulatory Visit | Attending: General Surgery

## 2017-07-05 ENCOUNTER — Ambulatory Visit (HOSPITAL_COMMUNITY)
Admission: RE | Admit: 2017-07-05 | Discharge: 2017-07-05 | Disposition: A | Discharge status: Home / Self Care | Payer: Self-pay | Source: Ambulatory Visit | Attending: General Surgery | Admitting: General Surgery

## 2017-07-05 DIAGNOSIS — K8010 Calculus of gallbladder with chronic cholecystitis without obstruction: Secondary | ICD-10-CM | POA: Insufficient documentation

## 2017-07-05 DIAGNOSIS — K8020 Calculus of gallbladder without cholecystitis without obstruction: Secondary | ICD-10-CM | POA: Diagnosis present

## 2017-07-05 DIAGNOSIS — Z833 Family history of diabetes mellitus: Secondary | ICD-10-CM | POA: Insufficient documentation

## 2017-07-05 DIAGNOSIS — Z79899 Other long term (current) drug therapy: Secondary | ICD-10-CM | POA: Insufficient documentation

## 2017-07-05 DIAGNOSIS — Z419 Encounter for procedure for purposes other than remedying health state, unspecified: Secondary | ICD-10-CM

## 2017-07-05 DIAGNOSIS — K219 Gastro-esophageal reflux disease without esophagitis: Secondary | ICD-10-CM | POA: Insufficient documentation

## 2017-07-05 HISTORY — DX: Calculus of gallbladder without cholecystitis without obstruction: K80.20

## 2017-07-05 HISTORY — PX: CHOLECYSTECTOMY: SHX55

## 2017-07-05 SURGERY — LAPAROSCOPIC CHOLECYSTECTOMY WITH INTRAOPERATIVE CHOLANGIOGRAM
Anesthesia: General

## 2017-07-05 MED ORDER — ACETAMINOPHEN 325 MG PO TABS: 650.0000 mg | ORAL_TABLET | ORAL | Status: DC | PRN

## 2017-07-05 MED ORDER — SUCCINYLCHOLINE CHLORIDE 20 MG/ML IJ SOLN
INTRAMUSCULAR | Status: DC | PRN
  Administered 2017-07-05: 100 mg via INTRAVENOUS

## 2017-07-05 MED ORDER — HYDROMORPHONE HCL-NACL 0.5-0.9 MG/ML-% IV SOSY
PREFILLED_SYRINGE | INTRAVENOUS | Status: AC
  Filled 2017-07-05: qty 2

## 2017-07-05 MED ORDER — FENTANYL CITRATE (PF) 100 MCG/2ML IJ SOLN: 25.0000 ug | INTRAMUSCULAR | Status: DC | PRN

## 2017-07-05 MED ORDER — LIDOCAINE 2% (20 MG/ML) 5 ML SYRINGE
INTRAMUSCULAR | Status: AC
  Filled 2017-07-05: qty 5

## 2017-07-05 MED ORDER — 0.9 % SODIUM CHLORIDE (POUR BTL) OPTIME
TOPICAL | Status: DC | PRN
  Administered 2017-07-05: 1000 mL

## 2017-07-05 MED ORDER — OXYCODONE HCL 5 MG PO TABS
ORAL_TABLET | ORAL | Status: DC
Start: 2017-07-05 — End: 2017-07-05
  Filled 2017-07-05: qty 1

## 2017-07-05 MED ORDER — BUPIVACAINE-EPINEPHRINE (PF) 0.5% -1:200000 IJ SOLN
INTRAMUSCULAR | Status: AC
  Filled 2017-07-05: qty 30

## 2017-07-05 MED ORDER — FENTANYL CITRATE (PF) 100 MCG/2ML IJ SOLN
INTRAMUSCULAR | Status: AC
  Filled 2017-07-05: qty 2

## 2017-07-05 MED ORDER — SUGAMMADEX SODIUM 200 MG/2ML IV SOLN
INTRAVENOUS | Status: AC
Start: 2017-07-05 — End: 2017-07-05
  Filled 2017-07-05: qty 2

## 2017-07-05 MED ORDER — SODIUM CHLORIDE 0.9 % IV SOLN: 250.0000 mL | INTRAVENOUS | Status: DC | PRN

## 2017-07-05 MED ORDER — ROCURONIUM BROMIDE 100 MG/10ML IV SOLN
INTRAVENOUS | Status: DC | PRN
  Administered 2017-07-05: 40 mg via INTRAVENOUS
  Administered 2017-07-05: 10 mg via INTRAVENOUS

## 2017-07-05 MED ORDER — SUCCINYLCHOLINE CHLORIDE 200 MG/10ML IV SOSY
PREFILLED_SYRINGE | INTRAVENOUS | Status: AC
  Filled 2017-07-05: qty 10

## 2017-07-05 MED ORDER — ROCURONIUM BROMIDE 50 MG/5ML IV SOSY
PREFILLED_SYRINGE | INTRAVENOUS | Status: AC
  Filled 2017-07-05: qty 5

## 2017-07-05 MED ORDER — LACTATED RINGERS IR SOLN
Status: DC | PRN
  Administered 2017-07-05: 1000 mL

## 2017-07-05 MED ORDER — MIDAZOLAM HCL 2 MG/2ML IJ SOLN
INTRAMUSCULAR | Status: AC
  Filled 2017-07-05: qty 2

## 2017-07-05 MED ORDER — SODIUM CHLORIDE 0.9% FLUSH: 3.0000 mL | INTRAVENOUS | Status: DC | PRN

## 2017-07-05 MED ORDER — MIDAZOLAM HCL 5 MG/5ML IJ SOLN
INTRAMUSCULAR | Status: DC | PRN
  Administered 2017-07-05: 2 mg via INTRAVENOUS

## 2017-07-05 MED ORDER — BUPIVACAINE-EPINEPHRINE 0.5% -1:200000 IJ SOLN
INTRAMUSCULAR | Status: DC | PRN
  Administered 2017-07-05: 15 mL

## 2017-07-05 MED ORDER — SUGAMMADEX SODIUM 200 MG/2ML IV SOLN
INTRAVENOUS | Status: DC | PRN
  Administered 2017-07-05: 200 mg via INTRAVENOUS

## 2017-07-05 MED ORDER — LACTATED RINGERS IV SOLN
INTRAVENOUS | Status: DC
  Administered 2017-07-05 (×3): via INTRAVENOUS

## 2017-07-05 MED ORDER — ONDANSETRON HCL 4 MG/2ML IJ SOLN
INTRAMUSCULAR | Status: DC | PRN
  Administered 2017-07-05: 4 mg via INTRAVENOUS

## 2017-07-05 MED ORDER — LIDOCAINE HCL (CARDIAC) 20 MG/ML IV SOLN
INTRAVENOUS | Status: DC | PRN
  Administered 2017-07-05: 50 mg via INTRAVENOUS

## 2017-07-05 MED ORDER — DEXAMETHASONE SODIUM PHOSPHATE 4 MG/ML IJ SOLN
INTRAMUSCULAR | Status: DC | PRN
  Administered 2017-07-05: 10 mg via INTRAVENOUS

## 2017-07-05 MED ORDER — HYDROMORPHONE HCL-NACL 0.5-0.9 MG/ML-% IV SOSY: 0.2500 mg | PREFILLED_SYRINGE | INTRAVENOUS | Status: DC | PRN

## 2017-07-05 MED ORDER — PROPOFOL 10 MG/ML IV BOLUS
INTRAVENOUS | Status: AC
  Filled 2017-07-05: qty 20

## 2017-07-05 MED ORDER — KETOROLAC TROMETHAMINE 30 MG/ML IJ SOLN
30.0000 mg | Freq: Once | INTRAMUSCULAR | Status: DC | PRN
  Administered 2017-07-05: 30 mg via INTRAVENOUS

## 2017-07-05 MED ORDER — OXYCODONE HCL 5 MG PO TABS
5.0000 mg | ORAL_TABLET | ORAL | Status: DC | PRN
  Administered 2017-07-05: 5 mg via ORAL

## 2017-07-05 MED ORDER — LACTATED RINGERS IV SOLN: INTRAVENOUS | Status: DC

## 2017-07-05 MED ORDER — ONDANSETRON HCL 4 MG/2ML IJ SOLN
INTRAMUSCULAR | Status: AC
  Filled 2017-07-05: qty 2

## 2017-07-05 MED ORDER — HYDROCODONE-ACETAMINOPHEN 5-325 MG PO TABS: 1.0000 | ORAL_TABLET | Freq: Four times a day (QID) | ORAL | 0 refills | Status: AC | PRN

## 2017-07-05 MED ORDER — PROPOFOL 10 MG/ML IV BOLUS
INTRAVENOUS | Status: DC | PRN
  Administered 2017-07-05 (×2): 100 mg via INTRAVENOUS
  Administered 2017-07-05: 200 mg via INTRAVENOUS

## 2017-07-05 MED ORDER — DEXAMETHASONE SODIUM PHOSPHATE 10 MG/ML IJ SOLN
INTRAMUSCULAR | Status: AC
  Filled 2017-07-05: qty 1

## 2017-07-05 MED ORDER — ACETAMINOPHEN 650 MG RE SUPP
650.0000 mg | RECTAL | Status: DC | PRN
  Filled 2017-07-05: qty 1

## 2017-07-05 MED ORDER — PROMETHAZINE HCL 25 MG/ML IJ SOLN: 6.2500 mg | INTRAMUSCULAR | Status: DC | PRN

## 2017-07-05 MED ORDER — FENTANYL CITRATE (PF) 100 MCG/2ML IJ SOLN
INTRAMUSCULAR | Status: DC | PRN
  Administered 2017-07-05: 100 ug via INTRAVENOUS
  Administered 2017-07-05 (×2): 50 ug via INTRAVENOUS
  Administered 2017-07-05: 100 ug via INTRAVENOUS

## 2017-07-05 MED ORDER — SODIUM CHLORIDE 0.9% FLUSH: 3.0000 mL | Freq: Two times a day (BID) | INTRAVENOUS | Status: DC

## 2017-07-05 MED ORDER — KETOROLAC TROMETHAMINE 30 MG/ML IJ SOLN
INTRAMUSCULAR | Status: AC
  Filled 2017-07-05: qty 1

## 2017-07-05 MED ORDER — IOPAMIDOL (ISOVUE-300) INJECTION 61%
INTRAVENOUS | Status: DC | PRN
  Administered 2017-07-05: 5 mL

## 2017-07-05 MED ORDER — CEFAZOLIN SODIUM-DEXTROSE 2-4 GM/100ML-% IV SOLN
2.0000 g | INTRAVENOUS | Status: AC
  Administered 2017-07-05: 2 g via INTRAVENOUS
  Filled 2017-07-05: qty 100

## 2017-07-05 SURGICAL SUPPLY — 31 items
APPLIER CLIP ROT 10 11.4 M/L (STAPLE) ×2
CABLE HIGH FREQUENCY MONO STRZ (ELECTRODE) ×2 IMPLANT
CLIP APPLIE ROT 10 11.4 M/L (STAPLE) ×1 IMPLANT
COVER MAYO STAND STRL (DRAPES) ×2 IMPLANT
COVER SURGICAL LIGHT HANDLE (MISCELLANEOUS) ×2 IMPLANT
DECANTER SPIKE VIAL GLASS SM (MISCELLANEOUS) ×2 IMPLANT
DERMABOND ADVANCED (GAUZE/BANDAGES/DRESSINGS) ×1
DERMABOND ADVANCED .7 DNX12 (GAUZE/BANDAGES/DRESSINGS) ×1 IMPLANT
DRAPE C-ARM 42X120 X-RAY (DRAPES) ×2 IMPLANT
ELECT REM PT RETURN 15FT ADLT (MISCELLANEOUS) ×2 IMPLANT
GLOVE EUDERMIC 7 POWDERFREE (GLOVE) ×2 IMPLANT
GOWN STRL REUS W/TWL XL LVL3 (GOWN DISPOSABLE) ×8 IMPLANT
HEMOSTAT SNOW SURGICEL 2X4 (HEMOSTASIS) IMPLANT
KIT BASIN OR (CUSTOM PROCEDURE TRAY) ×2 IMPLANT
POSITIONER SURGICAL ARM (MISCELLANEOUS) IMPLANT
POUCH RETRIEVAL ECOSAC 10 (ENDOMECHANICALS) IMPLANT
POUCH RETRIEVAL ECOSAC 10MM (ENDOMECHANICALS)
POUCH SPECIMEN RETRIEVAL 10MM (ENDOMECHANICALS) ×2 IMPLANT
SCISSORS LAP 5X35 DISP (ENDOMECHANICALS) ×2 IMPLANT
SET CHOLANGIOGRAPH MIX (MISCELLANEOUS) ×2 IMPLANT
SET IRRIG TUBING LAPAROSCOPIC (IRRIGATION / IRRIGATOR) ×2 IMPLANT
SLEEVE XCEL OPT CAN 5 100 (ENDOMECHANICALS) ×2 IMPLANT
SUT MNCRL AB 4-0 PS2 18 (SUTURE) ×4 IMPLANT
TAPE CLOTH 4X10 WHT NS (GAUZE/BANDAGES/DRESSINGS) IMPLANT
TOWEL OR 17X26 10 PK STRL BLUE (TOWEL DISPOSABLE) ×2 IMPLANT
TOWEL OR NON WOVEN STRL DISP B (DISPOSABLE) ×2 IMPLANT
TRAY LAPAROSCOPIC (CUSTOM PROCEDURE TRAY) ×2 IMPLANT
TROCAR BLADELESS OPT 5 100 (ENDOMECHANICALS) ×2 IMPLANT
TROCAR XCEL BLUNT TIP 100MML (ENDOMECHANICALS) ×2 IMPLANT
TROCAR XCEL NON-BLD 11X100MML (ENDOMECHANICALS) ×2 IMPLANT
TUBING INSUF HEATED (TUBING) ×2 IMPLANT

## 2017-07-05 NOTE — Transfer of Care (Signed)
Immediate Anesthesia Transfer of Care Note  Patient: Timothy Hebert  Procedure(s) Performed: Procedure(s): LAPAROSCOPIC CHOLECYSTECTOMY WITH INTRAOPERATIVE CHOLANGIOGRAM (N/A)  Patient Location: PACU  Anesthesia Type:MAC  Level of Consciousness:  sedated, patient cooperative and responds to stimulation  Airway & Oxygen Therapy:Patient Spontanous Breathing and Patient connected to face mask oxgen  Post-op Assessment:  Report given to PACU RN and Post -op Vital signs reviewed and stable  Post vital signs:  Reviewed and stable  Last Vitals:  Vitals:   07/05/17 0901  BP: 135/64  Pulse: 90  Resp: 18  Temp: 70.6 C    Complications: No apparent anesthesia complications

## 2017-07-05 NOTE — Anesthesia Preprocedure Evaluation (Signed)
Anesthesia Evaluation  Patient identified by MRN, date of birth, ID band Patient awake    Reviewed: Allergy & Precautions, NPO status , Patient's Chart, lab work & pertinent test results  Airway Mallampati: II  TM Distance: >3 FB Neck ROM: Full    Dental no notable dental hx.    Pulmonary neg pulmonary ROS,    Pulmonary exam normal breath sounds clear to auscultation       Cardiovascular negative cardio ROS Normal cardiovascular exam Rhythm:Regular Rate:Normal     Neuro/Psych negative neurological ROS  negative psych ROS   GI/Hepatic Neg liver ROS, GERD  ,  Endo/Other  negative endocrine ROS  Renal/GU negative Renal ROS  negative genitourinary   Musculoskeletal negative musculoskeletal ROS (+)   Abdominal   Peds negative pediatric ROS (+)  Hematology negative hematology ROS (+)   Anesthesia Other Findings   Reproductive/Obstetrics negative OB ROS                             Anesthesia Physical Anesthesia Plan  ASA: II  Anesthesia Plan: General   Post-op Pain Management:    Induction: Intravenous  PONV Risk Score and Plan: 2 and Ondansetron and Dexamethasone  Airway Management Planned: Oral ETT  Additional Equipment:   Intra-op Plan:   Post-operative Plan: Extubation in OR  Informed Consent: I have reviewed the patients History and Physical, chart, labs and discussed the procedure including the risks, benefits and alternatives for the proposed anesthesia with the patient or authorized representative who has indicated his/her understanding and acceptance.   Dental advisory given  Plan Discussed with: CRNA and Surgeon  Anesthesia Plan Comments:         Anesthesia Quick Evaluation

## 2017-07-05 NOTE — Discharge Instructions (Signed)
Colecistectoma laparoscpica, cuidados posteriores (Laparoscopic Cholecystectomy, Care After) Lea esta informacin sobre cmo cuidarse despus del procedimiento. El mdico tambin podr darle instrucciones ms especficas. Comunquese con el mdico si tiene problemas o preguntas. QU ESPERAR DESPUS DEL PROCEDIMIENTO Despus del procedimiento, es comn tener los siguientes sntomas:  Dolor en los lugares de la incisin. Le darn analgsicos para Human resources officer.  Nuseas o vmitos leves.  Meteorismo y Designer, fashion/clothing en el hombro debido al gas que se [place] durante el procedimiento. INSTRUCCIONES PARA EL CUIDADO EN EL HOGAR Cuidados de la incisin  Siga las indicaciones del mdico acerca del cuidado de las incisiones. Haga lo siguiente: ? Lvese las manos con agua y jabn antes de Multimedia programmer las vendas (vendaje). Use desinfectante para manos si no dispone de [place] y [place]. ? Cambie el vendaje como se lo haya indicado el mdico. ? No retire los puntos (suturas), el [employer] para la piel o las tiras [place]. Es posible que estos deban quedar puestos en la piel durante 2semanas o ms tiempo. Si los bordes de las tiras [address] empiezan a despegarse y Scientific laboratory technician, puede recortar los que estn sueltos. No retire las tiras [employer] por completo a menos que el mdico se lo indique.  No tome baos de inmersin, no nade ni use el jacuzzi hasta que el mdico lo autorice. Pregntele al mdico si puede ducharse. Delle Reining solo le permitan tomar baos de [place].  Controle todos los das la zona de la incisin para detectar signos de infeccin. Est atento a los siguientes signos: ? Aumento del enrojecimiento, de la hinchazn o del dolor. ? Ms lquido [place]. ? Calor. ? Pus o mal olor. Actividad  No conduzca ni use maquinaria pesada mientras toma analgsicos recetados.  No levante ningn objeto que pese ms de 10libras (4,5kg) hasta que el mdico lo autorice.  No practique deportes de  contacto hasta que el mdico lo autorice.  No conduzca durante 24horas si le dieron un medicamento para ayudarlo a que se relaje (sedante).  Descanse todo lo que sea necesario. No regrese al Aleen Campi o a la escuela hasta que el mdico lo autorice. Instrucciones generales  [employer] de venta libre y los recetados solamente como se lo haya indicado el mdico.  A fin de prevenir o tratar el estreimiento mientras toma analgsicos recetados, el mdico puede recomendarle lo siguiente: ? Product manager suficiente lquido para Pharmacologist la orina clara o de color amarillo plido. ? Tomar medicamentos recetados o de [employer]. ? Consumir alimentos ricos en fibra, como frutas y verduras frescas, cereales integrales y frijoles. ? Limitar el consumo de alimentos con alto contenido de grasas y azcares procesados, como alimentos fritos o dulces. SOLICITE ATENCIN MDICA SI:  Le aparece una erupcin cutnea.  Le aumenta el enrojecimiento, la hinchazn o el dolor alrededor de las incisiones.  Le sale ms lquido o sangre de las incisiones.  Las incisiones estn calientes al tacto.  Le sale pus o percibe mal olor de las incisiones.  Tiene fiebre.  Una o ms de las incisiones se abren.  SOLICITE ATENCIN MDICA DE INMEDIATO SI:  Tiene dificultad para respirar.  Siente dolor en el pecho.  Siente ms dolor en los hombros.  Se desmaya o se siente mareado al ponerse de pie.  Siente un dolor intenso en el abdomen.  Tiene nuseas o vmitos durante ms de Civil engineer, contracting.  Siente dolor en la pierna.  Esta informacin no tiene Theme park manager el consejo del mdico. Asegrese de hacerle  al mdico cualquier pregunta que tenga. Document Released: 06/27/2011 Document Revised: 08/05/2015 Document Reviewed: 05/02/2016 Elsevier Interactive Patient Education  2017 Elsevier Inc. CCS _____[facility]  Anestesia general en los adultos, cuidados posteriores (General Anesthesia,  Adult, Care After) Estas indicaciones le proporcionan informacin acerca de cmo deber cuidarse despus del procedimiento. El mdico tambin podr darle instrucciones ms especficas. El tratamiento ha sido planificado segn las prcticas mdicas actuales, pero en algunos casos pueden ocurrir problemas. Comunquese con el mdico si tiene algn problema o dudas despus del procedimiento. QU ESPERAR DESPUS DEL PROCEDIMIENTO Despus del procedimiento, es comn DIRECTVtener los siguientes sntomas:  Vmitos.  Dolor de Advertising copywritergarganta.  Lentitud mental. Es normal sentir lo siguiente:  Nuseas.  Fro o escalofros.  Somnolencia.  Cansancio.  Dolor o inflamacin, incluso en partes del cuerpo no afectadas por la ciruga. INSTRUCCIONES PARA EL CUIDADO EN EL HOGAR Durante al menos 24horas despus del procedimiento:  No haga lo siguiente: ? Participar en actividades que impliquen posibles cadas o lesiones. ? Conducir vehculos. ? Operar maquinarias pesadas. ? Beber alcohol. ? Tomar somnferos o medicamentos que causen somnolencia. ? Firmar documentos legales ni tomar Teachers Insurance and Annuity Associationdecisiones importantes. ? Cuidar a nios por su cuenta.  Hacer reposo. Comida y bebida  Si vomita, tome agua, jugo o sopa una vez que pueda beber sin vomitar.  Beba suficiente lquido para Photographermantener la orina clara o de color amarillo plido.  Asegrese de no tener nuseas antes de ingerir alimentos slidos.  Siga la dieta recomendada por el mdico. Instrucciones generales  Permanezca con un adulto responsable hasta que est completamente despierto y consciente.  Retome sus actividades normales como se lo haya indicado el mdico. Pregntele al mdico qu actividades son seguras para usted.  Tome los medicamentos de venta libre y los recetados solamente como se lo haya indicado el mdico.  Si fuma, no lo haga sin supervisin.  Concurra a todas las visitas de control como se lo haya indicado el mdico. Esto es  importante. SOLICITE ATENCIN MDICA SI:  Contina con nuseas o vmitos en su casa, y los medicamentos no ayudan.  No puede beber lquidos ni volver a comer.  No puede orinar despus de 8 a 12horas.  Tiene una erupcin cutnea.  Tiene fiebre.  Tiene cada vez ms enrojecimiento en la zona de la ciruga. SOLICITE ATENCIN MDICA DE INMEDIATO SI:  Tiene dificultad para respirar.  Siente dolor en el pecho.  Tiene una hemorragia imprevista.  Siente que tiene un problema potencialmente mortal o urgente. Esta informacin no tiene Theme park managercomo fin reemplazar el consejo del mdico. Asegrese de hacerle al mdico cualquier pregunta que tenga. Document Released: 11/14/2005 Document Revised: 12/05/2014 Document Reviewed: 10/29/2015 Elsevier Interactive Patient Education  [employer]2018 Elsevier Inc.

## 2017-07-05 NOTE — Anesthesia Procedure Notes (Signed)
Procedure Name: Intubation Date/Time: 07/05/2017 11:16 AM Performed by: Vanessa DurhamOCHRAN, Hermann Dottavio GLENN Pre-anesthesia Checklist: Patient identified, Emergency Drugs available, Suction available and Patient being monitored Patient Re-evaluated:Patient Re-evaluated prior to induction Oxygen Delivery Method: Circle system utilized Preoxygenation: Pre-oxygenation with 100% oxygen Induction Type: IV induction Ventilation: Mask ventilation without difficulty Laryngoscope Size: 2 and Miller Grade View: Grade I Tube type: Oral Tube size: 7.5 mm Number of attempts: 1 Airway Equipment and Method: Stylet Placement Confirmation: ETT inserted through vocal cords under direct vision,  positive ETCO2 and breath sounds checked- equal and bilateral Secured at: 22 cm Tube secured with: Tape Dental Injury: Teeth and Oropharynx as per pre-operative assessment

## 2017-07-05 NOTE — Anesthesia Postprocedure Evaluation (Signed)
Anesthesia Post Note  Patient: Timothy Hebert  Procedure(s) Performed: Procedure(s) (LRB): LAPAROSCOPIC CHOLECYSTECTOMY WITH INTRAOPERATIVE CHOLANGIOGRAM (N/A)     Patient location during evaluation: PACU Anesthesia Type: General Level of consciousness: awake and alert Pain management: pain level controlled Vital Signs Assessment: post-procedure vital signs reviewed and stable Respiratory status: spontaneous breathing, nonlabored ventilation, respiratory function stable and patient connected to nasal cannula oxygen Cardiovascular status: blood pressure returned to baseline and stable Postop Assessment: no signs of nausea or vomiting Anesthetic complications: no    Last Vitals:  Vitals:   07/05/17 1315 07/05/17 1333  BP:  123/78  Pulse: 95 78  Resp: 17 14  Temp:  36.6 C    Last Pain:  Vitals:   07/05/17 1336  TempSrc:   PainSc: 2                  Aimar Borghi S

## 2017-07-05 NOTE — Interval H&P Note (Signed)
History and Physical Interval Note:  07/05/2017 9:21 AM  Timothy Hebert  has presented today for surgery, with the diagnosis of gallstones  The various methods of treatment have been discussed with the patient and family. After consideration of risks, benefits and other options for treatment, the patient has consented to  Procedure(s): LAPAROSCOPIC CHOLECYSTECTOMY WITH INTRAOPERATIVE CHOLANGIOGRAM POSSIBLE OPEN (N/A) as a surgical intervention .  The patient's history has been reviewed, patient examined, no change in status, stable for surgery.  I have reviewed the patient's chart and labs.  Questions were answered to the patient's satisfaction.     Ernestene MentionINGRAM,Cayci Mcnabb M

## 2017-07-05 NOTE — Op Note (Signed)
Patient Name:           Timothy Hebert   Date of Surgery:        07/05/2017  Pre op Diagnosis:      Chronic cholecystitis with cholelithiasis  Post op Diagnosis:    Same  Procedure:                 Laparoscopic cholecystectomy with intraoperative cholangiogram  Surgeon:                     Angelia Mould. Derrell Lolling, M.D., FACS  Assistant:                      OR staff   Indication for Assistant: N/A  Operative Indications:   This is a healthy 26 year old Hispanic male who is brought to the operating room electively for cholecystectomy.  He recently developed severe upper abdominal pain back pain nausea and vomiting and was taken to the emergency room by EMS.  The pain resolved.  Lab work showed elevated WBC, elevated a LT, elevated AST.  Ultrasound showed small gallstones but no inflammation.  He was evaluated in the office.  I have discussed cholecystectomy with him and his parents.  He wishes to proceed with cholecystectomy at this time.  Operative Findings:       The gallbladder was thin-walled but chronically inflamed and discolored.  The anatomy of the cystic duct, cystic artery, and common bile duct were conventional.  Intraoperative cholangiogram was normal showing normal intrahepatic and extrahepatic biliary anatomy, good flow of contrast into the duodenum without obstruction, and no filling defect.  The stomach, duodenum, liver, small intestine, large intestine were grossly normal.  Procedure in Detail:          Following the induction of general endotracheal anesthesia a surgical timeout was performed, intravenous antibodies were given, the abdomen was prepped and draped in a sterile fashion, and trocar site were infiltrated with 0.5% Marcaine with epinephrine.     An 11 mm Hassan trocar was placed in the midline just above the umbilicus with an open technique.  This was secured with the Purstring suture of 0 Vicryl and pneumoperitoneum was created.  Video camera was inserted.  There is no  evidence of any bleeding or intestinal injury.  Trocar was placed in subxiphoid region and 2 trochars placed in the right upper quadrant.  After four-quadrant inspection we elevated the gallbladder fundus.  We retracted the infundibulum.  We slowly dissected the peritoneum off the neck of the gallbladder.  We isolated the cystic duct and the cystic artery.  Cholangiogram catheter was inserted into the cystic duct and a cholangiogram was obtained as described above.  The cholangiogram was normal.  The catheter was removed and the cystic duct was secured with multiple medical clips and divided.  The cystic artery was isolated and secured with multiple medical clips and divided.  Gallbladder dissected from its bed with electrocautery, placed in a specimen bag and removed.  The operative field was copiously irrigated.  A little bit of oozing from the gallbladder bed was easily controlled with electrocautery.  At completion of the case there was no bleeding or bile leak and the irrigation fluid was completely clear.     The trochars were removed under direct vision and there was no bleeding the trocar sites.  The fascia at the umbilicus was closed with 0 Vicryl sutures under direct vision.  The pneumoperitoneum was released.  The wounds  were irrigated and skin closed with subcuticular sutures of 4-0 Monocryl and Dermabond.  The patient tolerated the procedure well was taken to PACU in stable condition.  EBL 10-20 mL.  Counts correct.  Complications none.     Angelia MouldHaywood M. Derrell LollingIngram, M.D., FACS General and Minimally Invasive Surgery Breast and Colorectal Surgery   Addendum: I logged on to the [facility] website and reviewed his prescription medication history  07/05/2017 12:31 PM

## 2017-07-06 ENCOUNTER — Encounter (HOSPITAL_COMMUNITY): Payer: Self-pay | Admitting: General Surgery

## 2017-08-18 ENCOUNTER — Ambulatory Visit: Payer: Self-pay | Admitting: Physician Assistant

## 2018-02-11 IMAGING — DX DG ABDOMEN ACUTE W/ 1V CHEST
3 series · 4 of 4 positions shown · non-contrast
Comparison: None.

CLINICAL DATA: Constipation, nausea.

EXAM:
DG ABDOMEN ACUTE W/ 1V CHEST

[chest pa]
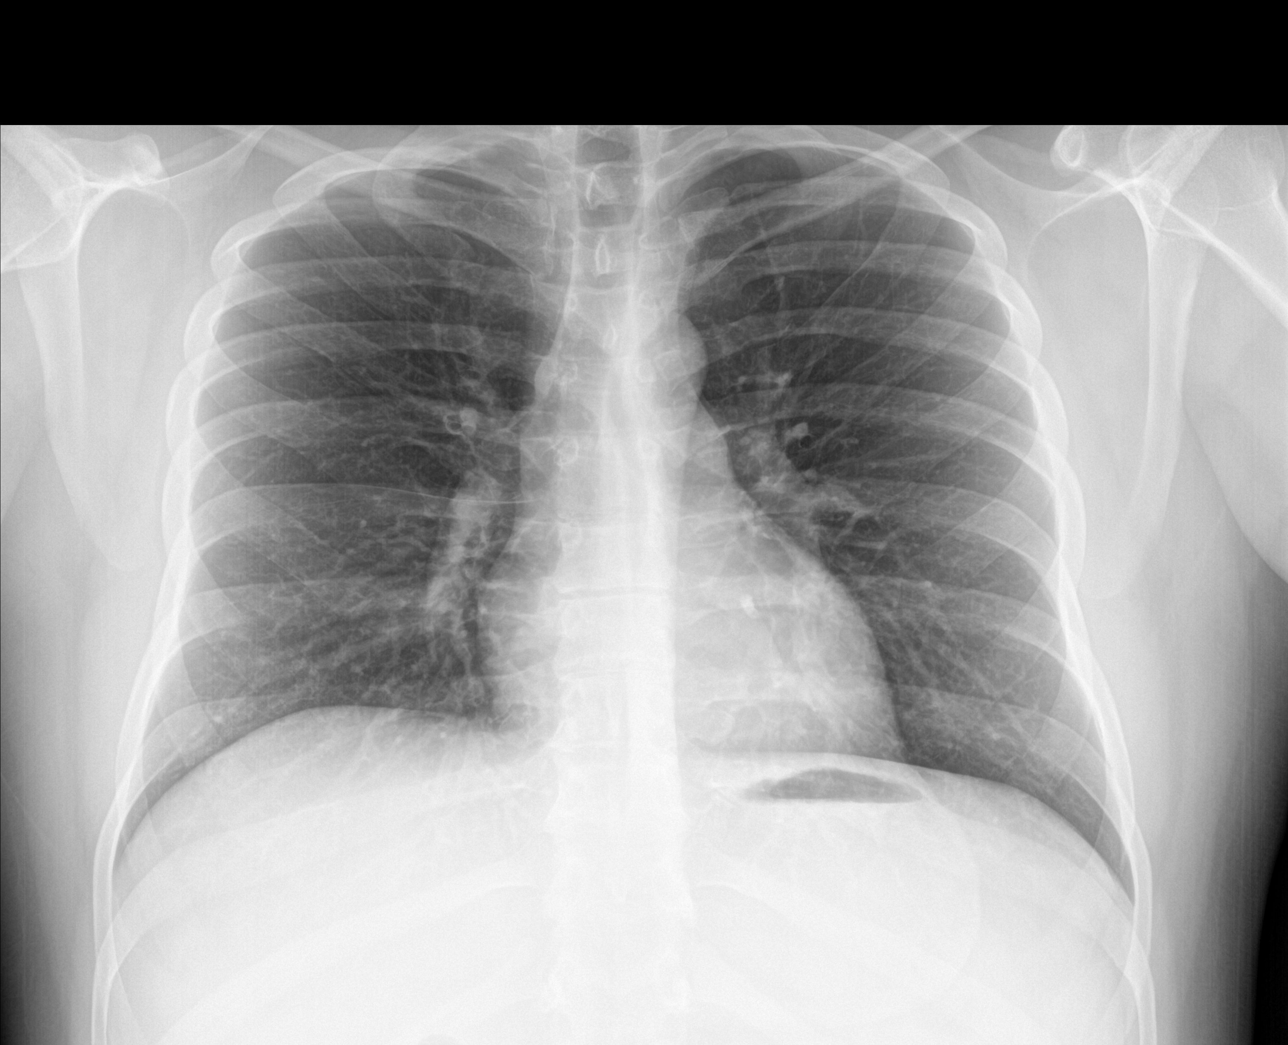

[Series 2: abdomen erect · 0.14mm/px · 2 of 2 slices shown]
[im 1/2]
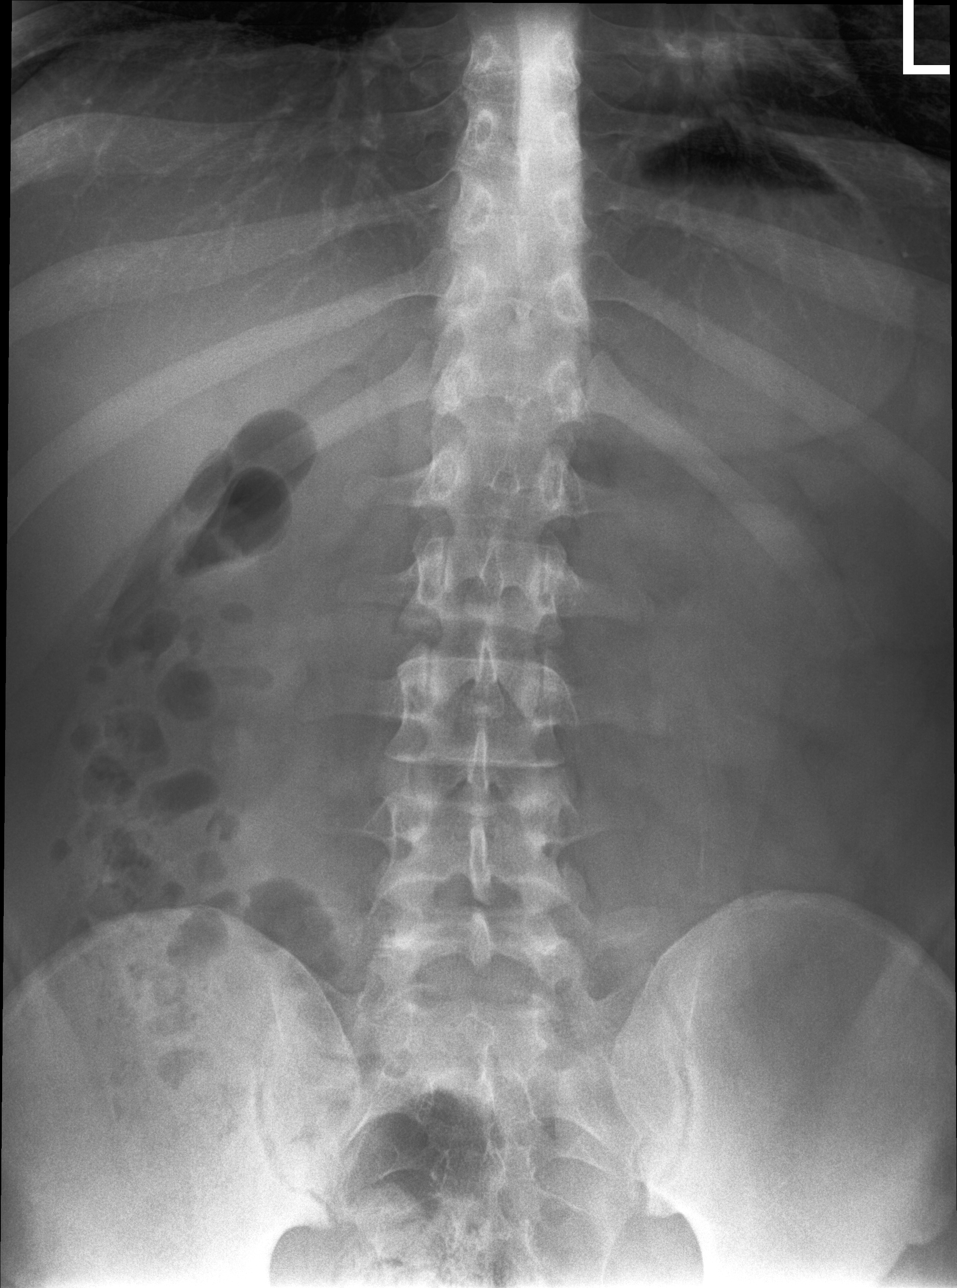
[im 2/2]
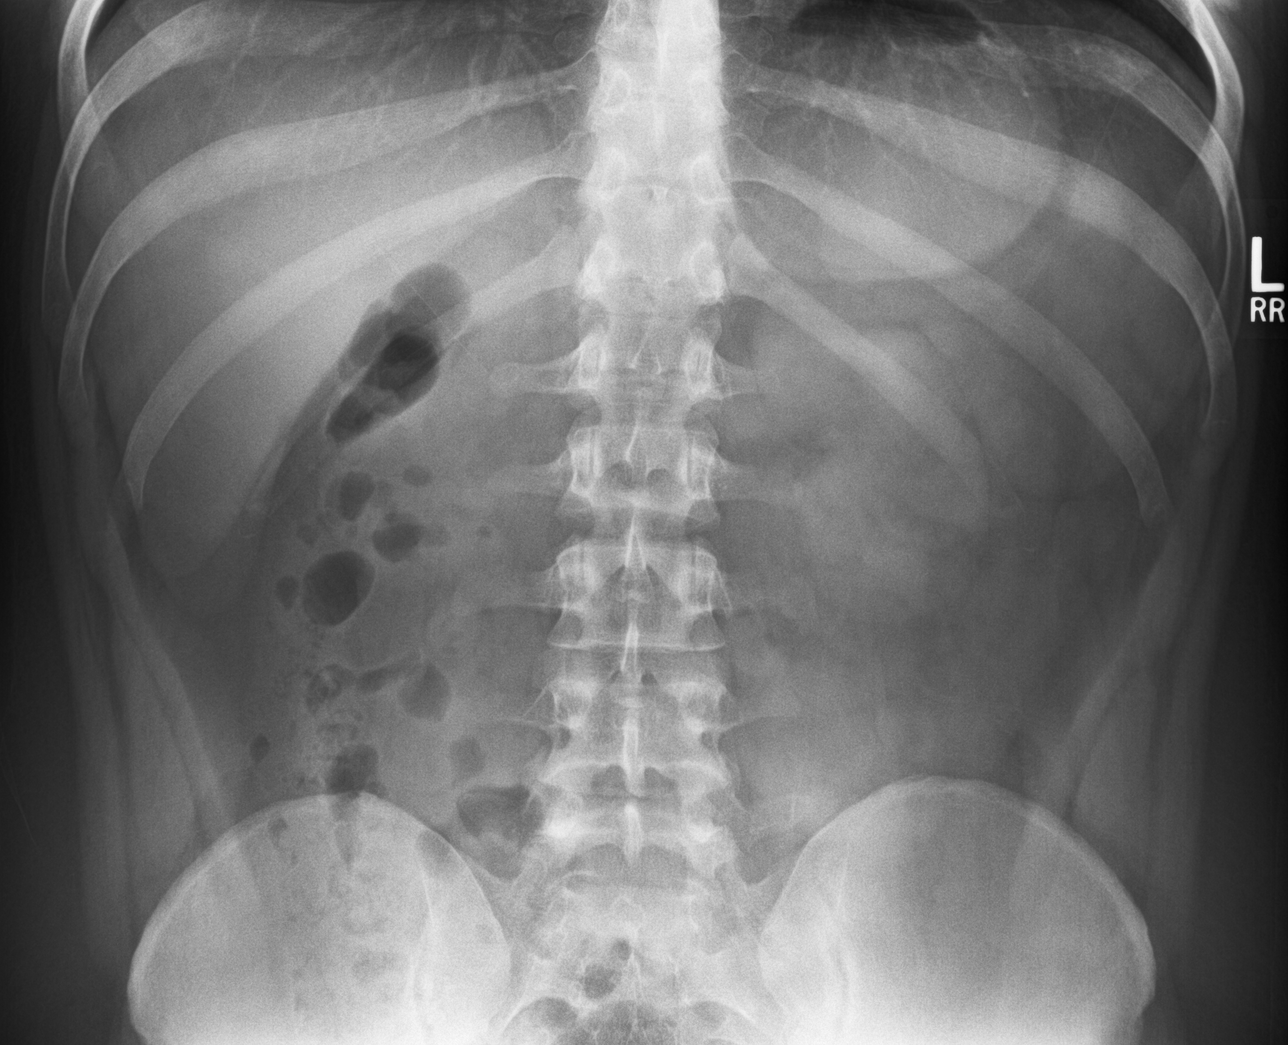

[abdomen supine]
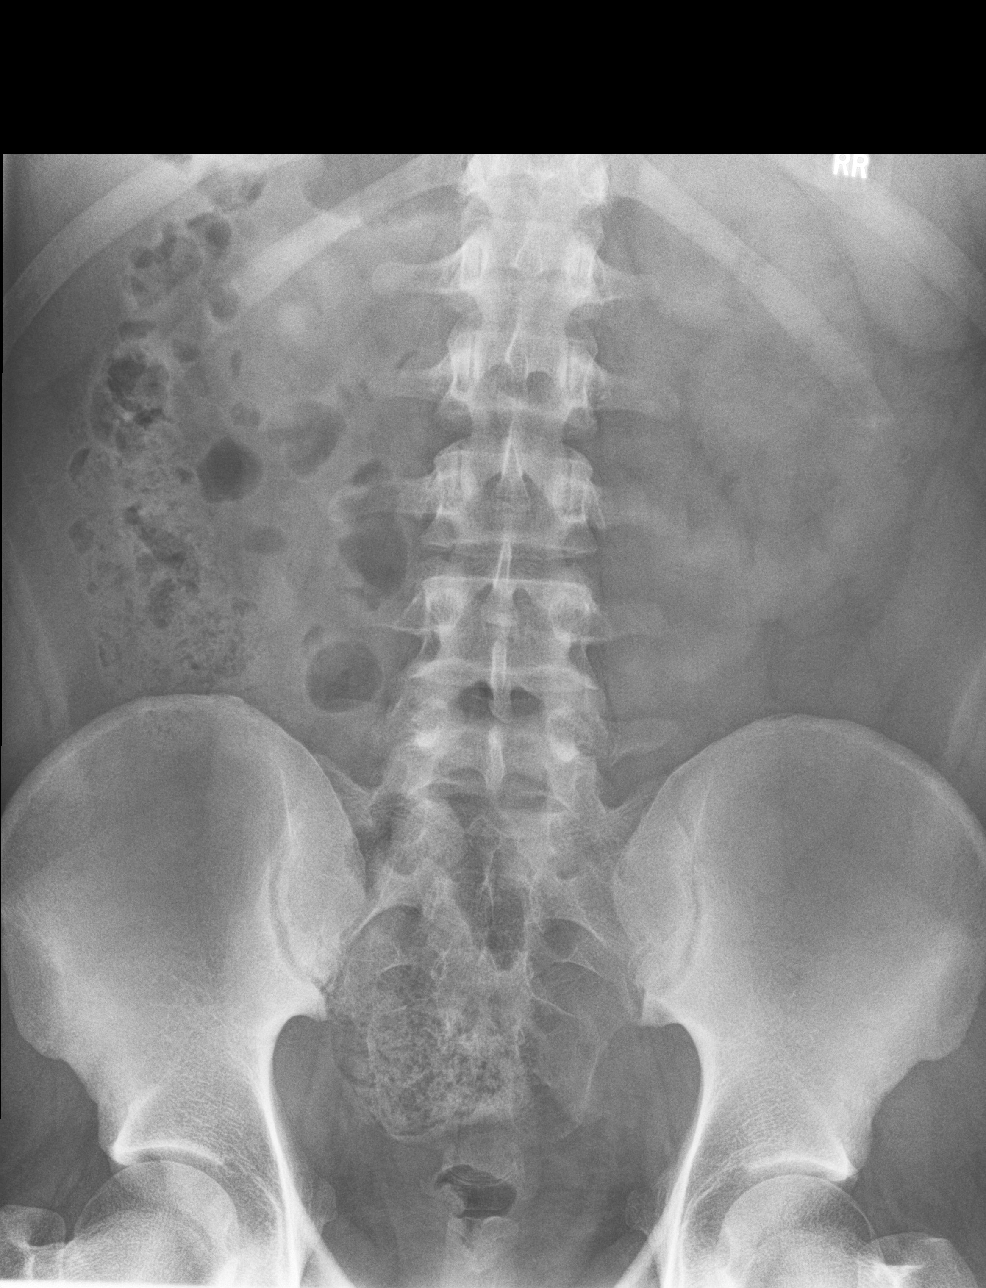

[4 of 4 positions shown; findings below may reference images not displayed]

FINDINGS: There is no evidence of dilated bowel loops or free intraperitoneal
air. Moderate stool burden is noted in the right and sigmoid colon.
No radiopaque calculi or other significant radiographic abnormality
is seen. Heart size and mediastinal contours are within normal
limits. Both lungs are clear.
IMPRESSION: No abnormal bowel dilatation is noted. Moderate stool burden is
noted. No acute cardiopulmonary disease.

## 2018-03-01 ENCOUNTER — Encounter: Payer: Self-pay | Admitting: Physician Assistant

## 2018-04-21 IMAGING — RF DG CHOLANGIOGRAM OPERATIVE
1 series · 4 of 4 positions shown · non-contrast
Comparison: None.

CLINICAL DATA: Right upper quadrant pain

EXAM:
INTRAOPERATIVE CHOLANGIOGRAM
TECHNIQUE: Cholangiographic images from the C-arm fluoroscopic device were
submitted for interpretation post-operatively. Please see the
procedural report for the amount of contrast and the fluoroscopy
time utilized.

[Series 1: run · 4 of 95 frames shown]
[frame 15/95]
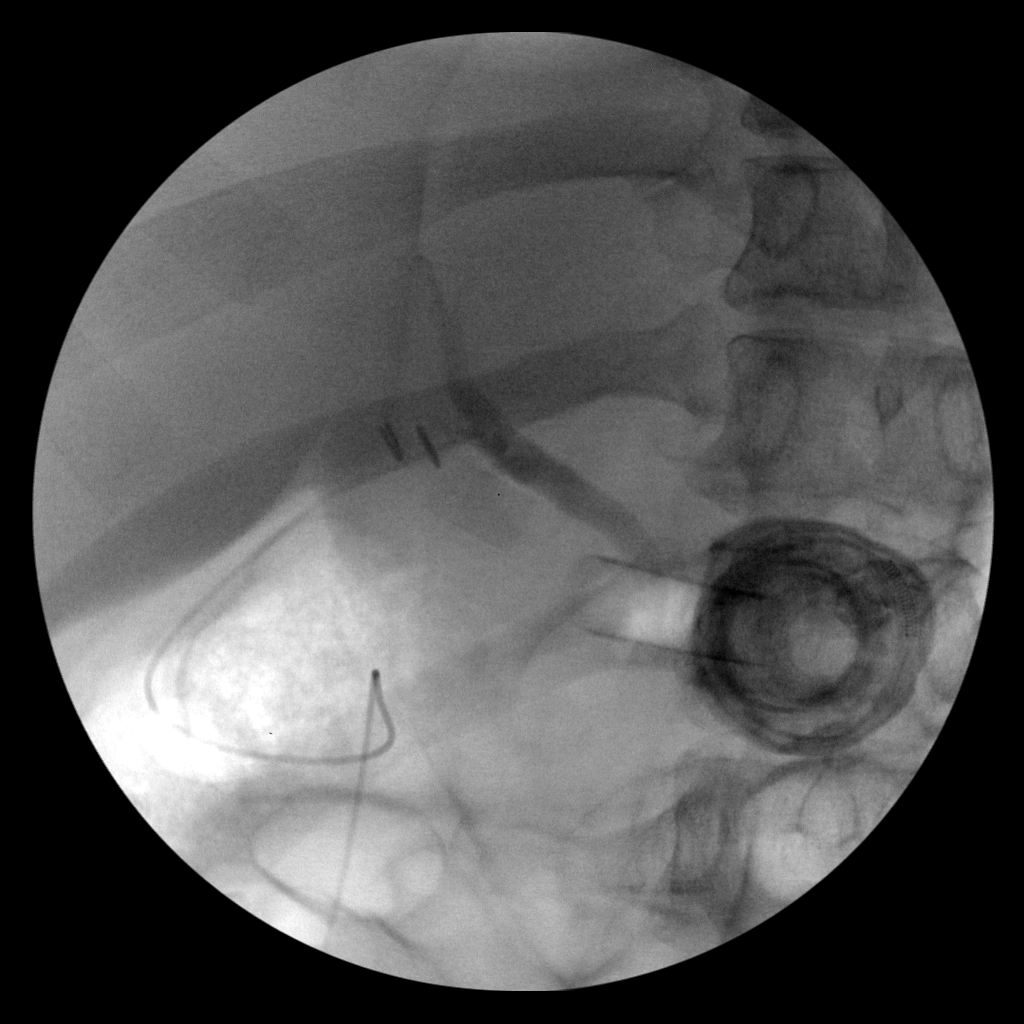
[frame 48/95]
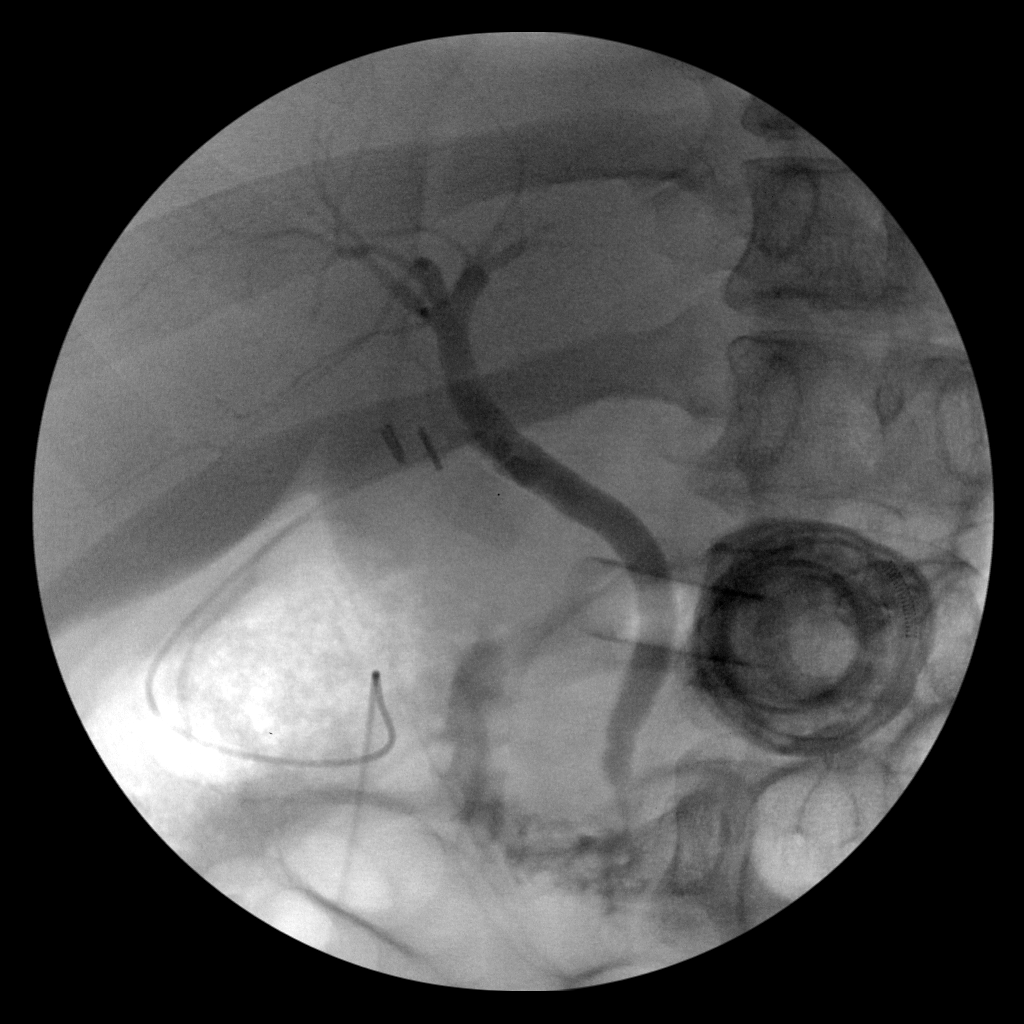
[frame 63/95]
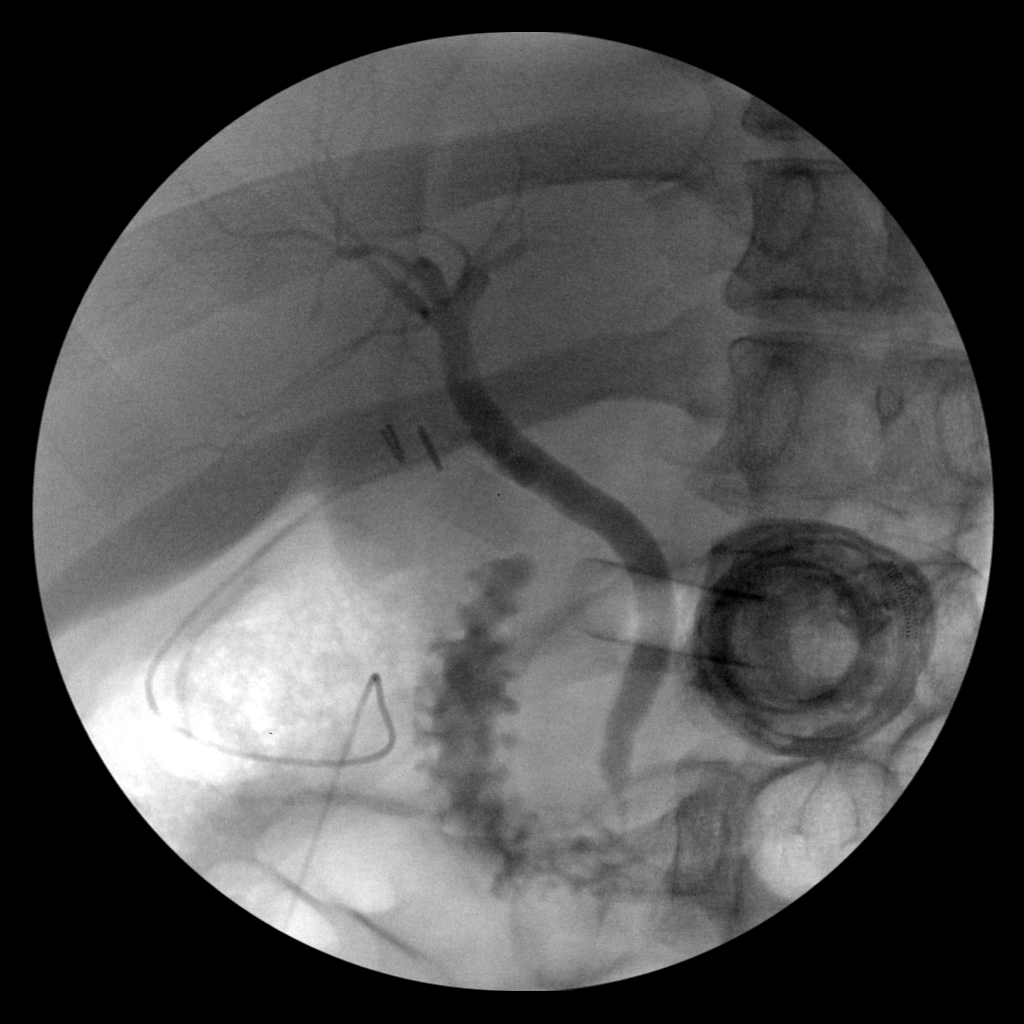
[frame 81/95]
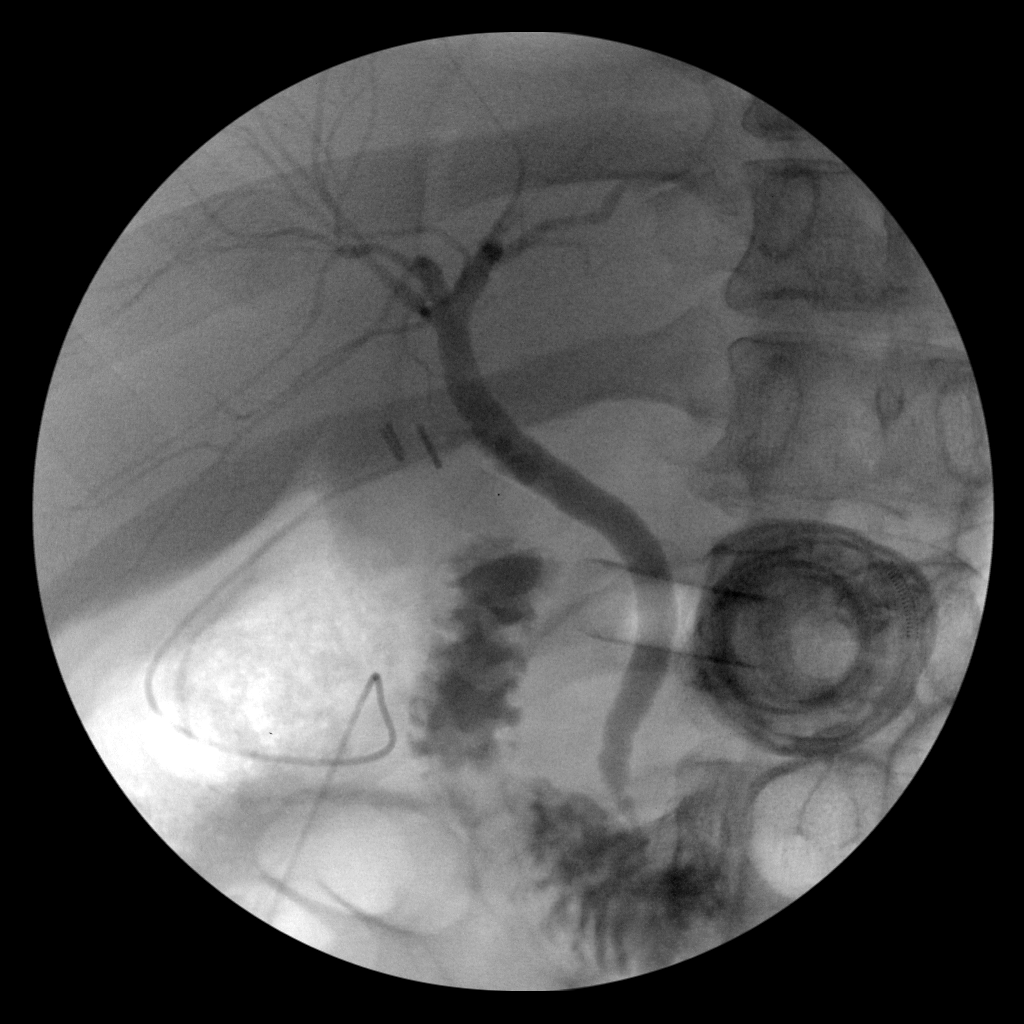

[4 of 4 positions shown; findings below may reference images not displayed]

FINDINGS: Contrast fills the biliary tree and duodenum without filling defects
in the common bile duct.
IMPRESSION: Patent biliary tree.

## 2018-08-03 ENCOUNTER — Encounter: Payer: Self-pay | Admitting: Emergency Medicine

## 2018-08-03 ENCOUNTER — Ambulatory Visit: Payer: Managed Care, Other (non HMO) | Admitting: Emergency Medicine

## 2018-08-03 ENCOUNTER — Other Ambulatory Visit: Payer: Self-pay

## 2018-08-03 VITALS — BP 104/68 | HR 107 | Temp 98.8°F | Resp 18 | Ht 68.0 in | Wt 244.6 lb

## 2018-08-03 DIAGNOSIS — Z23 Encounter for immunization: Secondary | ICD-10-CM | POA: Diagnosis not present

## 2018-08-03 DIAGNOSIS — H6121 Impacted cerumen, right ear: Secondary | ICD-10-CM

## 2018-08-03 MED ORDER — HYDROCORTISONE-ACETIC ACID 1-2 % OT SOLN: 3.0000 [drp] | Freq: Three times a day (TID) | OTIC | 1 refills | Status: AC

## 2018-08-03 NOTE — Patient Instructions (Addendum)
   If you have lab work done today you will be contacted with your lab results within the next 2 weeks.  If you have not heard from us then please contact us. The fastest way to get your results is to register for My Chart.   IF you received an x-ray today, you will receive an invoice from [facility]. Please contact [facility] at [phone] with questions or concerns regarding your invoice.   IF you received labwork today, you will receive an invoice from [facility]. Please contact [facility] at [phone] with questions or concerns regarding your invoice.   Our billing staff will not be able to assist you with questions regarding bills from these companies.  You will be contacted with the lab results as soon as they are available. The fastest way to get your results is to activate your My Chart account. Instructions are located on the last page of this paperwork. If you have not heard from us regarding the results in 2 weeks, please contact this office.      Earwax Buildup, Adult The ears produce a substance called earwax that helps keep bacteria out of the ear and protects the skin in the ear canal. Occasionally, earwax can build up in the ear and cause discomfort or hearing loss. What increases the risk? This condition is more likely to develop in people who:  Are male.  Are elderly.  Naturally produce more earwax.  Clean their ears often with cotton swabs.  Use earplugs often.  Use in-ear headphones often.  Wear hearing aids.  Have narrow ear canals.  Have earwax that is overly thick or sticky.  Have eczema.  Are dehydrated.  Have excess hair in the ear canal.  What are the signs or symptoms? Symptoms of this condition include:  Reduced or muffled hearing.  A feeling of fullness in the ear or feeling that the ear is plugged.  Fluid coming from the ear.  Ear pain.  Ear itch.  Ringing in the ear.  Coughing.  An obvious piece of  earwax that can be seen inside the ear canal.  How is this diagnosed? This condition may be diagnosed based on:  Your symptoms.  Your medical history.  An ear exam. During the exam, your health care provider will look into your ear with an instrument called an otoscope.  You may have tests, including a hearing test. How is this treated? This condition may be treated by:  Using ear drops to soften the earwax.  Having the earwax removed by a health care provider. The health care provider may: ? Flush the ear with water. ? Use an instrument that has a loop on the end (curette). ? Use a suction device.  Surgery to remove the wax buildup. This may be done in severe cases.  Follow these instructions at home:  Take over-the-counter and prescription medicines only as told by your health care provider.  Do not put any objects, including cotton swabs, into your ear. You can clean the opening of your ear canal with a washcloth or facial tissue.  Follow instructions from your health care provider about cleaning your ears. Do not over-clean your ears.  Drink enough fluid to keep your urine clear or pale yellow. This will help to thin the earwax.  Keep all follow-up visits as told by your health care provider. If earwax builds up in your ears often or if you use hearing aids, consider seeing your health care provider for routine,   preventive ear cleanings. Ask your health care provider how often you should schedule your cleanings.  If you have hearing aids, clean them according to instructions from the manufacturer and your health care provider. Contact a health care provider if:  You have ear pain.  You develop a fever.  You have blood, pus, or other fluid coming from your ear.  You have hearing loss.  You have ringing in your ears that does not go away.  Your symptoms do not improve with treatment.  You feel like the room is spinning (vertigo). Summary  Earwax can build up in  the ear and cause discomfort or hearing loss.  The most common symptoms of this condition include reduced or muffled hearing and a feeling of fullness in the ear or feeling that the ear is plugged.  This condition may be diagnosed based on your symptoms, your medical history, and an ear exam.  This condition may be treated by using ear drops to soften the earwax or by having the earwax removed by a health care provider.  Do not put any objects, including cotton swabs, into your ear. You can clean the opening of your ear canal with a washcloth or facial tissue. This information is not intended to replace advice given to you by your health care provider. Make sure you discuss any questions you have with your health care provider. Document Released: 12/22/2004 Document Revised: 01/25/2017 Document Reviewed: 01/25/2017 Elsevier Interactive Patient Education  2018 Elsevier Inc.  

## 2018-08-03 NOTE — Progress Notes (Signed)
Timothy Hebert 27 y.o.   Chief Complaint  Patient presents with  . Ear Problem    RIGHT ear per patient feels like it is clogged x 3 days    HISTORY OF PRESENT ILLNESS: This is a 27 y.o. male complaining of fullness of right ear for 3 days.  HPI   Prior to Admission medications   Medication Sig Start Date End Date Taking? Authorizing Provider  HYDROcodone-acetaminophen (NORCO) 5-325 MG tablet Take 1-2 tablets by mouth every 6 (six) hours as needed for moderate pain or severe pain. Patient not taking: Reported on 08/03/2018 07/05/17   Claud Kelp, MD  ondansetron (ZOFRAN ODT) 4 MG disintegrating tablet 4mg  ODT q4 hours prn nausea/vomit Patient not taking: Reported on 08/03/2018 06/24/17   Rolan Bucco, MD  pantoprazole (PROTONIX) 20 MG tablet Take 1 tablet (20 mg total) by mouth daily. Patient not taking: Reported on 08/03/2018 06/24/17   Rolan Bucco, MD  traMADol (ULTRAM) 50 MG tablet Take 1 tablet (50 mg total) by mouth every 6 (six) hours as needed. Patient not taking: Reported on 08/03/2018 06/24/17   Rolan Bucco, MD    No Known Allergies  Patient Active Problem List   Diagnosis Date Noted  . Gallstones 07/05/2017  . BMI 36.0-36.9,adult 02/24/2017  . Family history of diabetes mellitus in father 02/24/2017    Past Medical History:  Diagnosis Date  . Gallstones   . Gallstones 07/05/2017  . GERD (gastroesophageal reflux disease)     Past Surgical History:  Procedure Laterality Date  . CHOLECYSTECTOMY N/A 07/05/2017   Procedure: LAPAROSCOPIC CHOLECYSTECTOMY WITH INTRAOPERATIVE CHOLANGIOGRAM;  Surgeon: Claud Kelp, MD;  Location: [facility];  Service: General;  Laterality: N/A;  . NO PAST SURGERIES      Social History   Socioeconomic History  . Marital status: Single    Spouse name: Not on file  . Number of children: 0  . Years of education: Not on file  . Highest education level: Not on file  Occupational History  . 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 Financial resource strain: Not on file  . Food insecurity:    Worry: Not on file    Inability: Not on file  . Transportation needs:    Medical: Not on file    Non-medical: Not on file  Tobacco Use  . Smoking status: Never Smoker  . Smokeless tobacco: Never Used  Substance and Sexual Activity  . Alcohol use: No  . Drug use: No  . Sexual activity: Not on file  Lifestyle  . Physical activity:    Days per week: Not on file    Minutes per session: Not on file  . Stress: Not on file  Relationships  . Social connections:    Talks on phone: Not on file    Gets together: Not on file    Attends religious service: Not on file    Active member of club or organization: Not on file    Attends meetings of clubs or organizations: Not on file    Relationship status: Not on file  . Intimate partner violence:    Fear of current or ex partner: Not on file    Emotionally abused: Not on file    Physically abused: Not on file    Forced sexual activity: Not on file  Other Topics Concern  . Not on file  Social History Narrative   Lives with his parents and 3 younger  brothers.   Born in [place]. Came to the [place] since age 89 years.    Family History  Problem Relation Age of Onset  . Diabetes Father      Review of Systems  Constitutional: Negative.  Negative for chills and fever.  HENT: Positive for hearing loss.   Respiratory: Negative for cough and shortness of breath.   Cardiovascular: Negative for chest pain.  Gastrointestinal: Negative for nausea and vomiting.  Skin: Negative.  Negative for rash.  Neurological: Negative for dizziness and headaches.  All other systems reviewed and are negative.  Vitals:   08/03/18 1654  BP: 104/68  Pulse: (!) 107  Resp: 18  Temp: 98.8 F (37.1 C)  SpO2: 98%     Physical Exam  Constitutional: He is oriented to person, place, and time. He appears well-developed and well-nourished.    HENT:  Head: Normocephalic and atraumatic.  Right ear: Positive impacted cerumen unable to see TM  Eyes: Pupils are equal, round, and reactive to light. EOM are normal.  Neck: Normal range of motion. Neck supple.  Cardiovascular: Normal rate and regular rhythm.  Pulmonary/Chest: Effort normal and breath sounds normal.  Musculoskeletal: Normal range of motion.  Neurological: He is alert and oriented to person, place, and time.  Skin: Skin is warm.  Vitals reviewed.  Ceruminosis is noted.  Wax is removed by syringing and manual debridement. Instructions for home care to prevent wax buildup are given.   ASSESSMENT & PLAN: Gryphon was seen today for ear problem.  Diagnoses and all orders for this visit:  Ceruminosis, right -     Ear wax removal  Hearing loss of right ear due to cerumen impaction  Need for prophylactic vaccination and inoculation against influenza -     Flu Vaccine QUAD 36+ mos IM  Need for diphtheria-tetanus-pertussis (Tdap) vaccine -     Tdap vaccine greater than or equal to 7yo IM    Patient Instructions       If you have lab work done today you will be contacted with your lab results within the next 2 weeks.  If you have not heard from [place] then please contact us. The fastest way to get your results is to register for My Chart.   IF you received an x-ray today, you will receive an invoice from [facility]. Please contact [facility] at [phone] with questions or concerns regarding your invoice.   IF you received labwork today, you will receive an invoice from [facility]. Please contact [facility] at [phone] with questions or concerns regarding your invoice.   Our billing staff will not be able to assist you with questions regarding bills from these companies.  You will be contacted with the lab results as soon as they are available. The fastest way to get your results is to activate your My Chart account. Instructions are located  on the last page of this paperwork. If you have not heard from [place] regarding the results in 2 weeks, please contact this office.     Earwax Buildup, Adult The ears produce a substance called earwax that helps keep bacteria out of the ear and protects the skin in the ear canal. Occasionally, earwax can build up in the ear and cause discomfort or hearing loss. What increases the risk? This condition is more likely to develop in people who:  Are male.  Are elderly.  Naturally produce more earwax.  Clean their ears often with cotton swabs.  Use earplugs often.  Use in-ear  headphones often.  Wear hearing aids.  Have narrow ear canals.  Have earwax that is overly thick or sticky.  Have eczema.  Are dehydrated.  Have excess hair in the ear canal.  What are the signs or symptoms? Symptoms of this condition include:  Reduced or muffled hearing.  A feeling of fullness in the ear or feeling that the ear is plugged.  Fluid coming from the ear.  Ear pain.  Ear itch.  Ringing in the ear.  Coughing.  An obvious piece of earwax that can be seen inside the ear canal.  How is this diagnosed? This condition may be diagnosed based on:  Your symptoms.  Your medical history.  An ear exam. During the exam, your health care provider will look into your ear with an instrument called an otoscope.  You may have tests, including a hearing test. How is this treated? This condition may be treated by:  Using ear drops to soften the earwax.  Having the earwax removed by a health care provider. The health care provider may: ? Flush the ear with water. ? Use an instrument that has a loop on the end (curette). ? Use a suction device.  Surgery to remove the wax buildup. This may be done in severe cases.  Follow these instructions at home:  Take over-the-counter and prescription medicines only as told by your health care provider.  Do not put any objects, including cotton  swabs, into your ear. You can clean the opening of your ear canal with a washcloth or facial tissue.  Follow instructions from your health care provider about cleaning your ears. Do not over-clean your ears.  Drink enough fluid to keep your urine clear or pale yellow. This will help to thin the earwax.  Keep all follow-up visits as told by your health care provider. If earwax builds up in your ears often or if you use hearing aids, consider seeing your health care provider for routine, preventive ear cleanings. Ask your health care provider how often you should schedule your cleanings.  If you have hearing aids, clean them according to instructions from the manufacturer and your health care provider. Contact a health care provider if:  You have ear pain.  You develop a fever.  You have blood, pus, or other fluid coming from your ear.  You have hearing loss.  You have ringing in your ears that does not go away.  Your symptoms do not improve with treatment.  You feel like the room is spinning (vertigo). Summary  Earwax can build up in the ear and cause discomfort or hearing loss.  The most common symptoms of this condition include reduced or muffled hearing and a feeling of fullness in the ear or feeling that the ear is plugged.  This condition may be diagnosed based on your symptoms, your medical history, and an ear exam.  This condition may be treated by using ear drops to soften the earwax or by having the earwax removed by a health care provider.  Do not put any objects, including cotton swabs, into your ear. You can clean the opening of your ear canal with a washcloth or facial tissue. This information is not intended to replace advice given to you by your health care provider. Make sure you discuss any questions you have with your health care provider. Document Released: 12/22/2004 Document Revised: 01/25/2017 Document Reviewed: 01/25/2017 Elsevier Interactive Patient  Education  2018 [employer].      Edwina Barth, MD  [facility]

## 2018-08-08 ENCOUNTER — Telehealth: Payer: Self-pay | Admitting: *Deleted

## 2018-08-08 NOTE — Telephone Encounter (Signed)
Called [facility] pharmacy on [address] spoke to [employer] (pharmacist), which otic medication is cheaper for the patient. She suggested Cortisporin Otic suspension the cost is $64.78, which the insurance should pay because it is cheaper. I advised Dr Alvy Bimler and it is okay to use. I will call the pharmacy with the order change.

## 2018-08-08 NOTE — Telephone Encounter (Signed)
Called pharmacy to okay cortisporin otic suspension, I will fax new prescription with Dr Latrelle Dodrill signature. Confirmation page received 3:07 pm.

## 2018-08-28 ENCOUNTER — Telehealth: Payer: Self-pay | Admitting: *Deleted

## 2018-08-28 NOTE — Telephone Encounter (Signed)
Called pharmacy spoke to Joe (pharmacist) concerning HC/Acetic Otic Sol on back order. The pharmacist stated the prescription was filled on 08/09/2018 with cortisporin otic drops.
